# Patient Record
Sex: Female | Born: 2000 | Race: Black or African American | Hispanic: No | Marital: Single | Smoking: Former smoker
Health system: Southern US, Community
[De-identification: ages and names within clinical notes are randomized; demographics above are authoritative.]

## PROBLEM LIST (undated history)

## (undated) HISTORY — PX: DENTAL SURGERY: SHX609

---
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
## 2020-04-19 ENCOUNTER — Other Ambulatory Visit: Payer: Self-pay

## 2020-04-19 ENCOUNTER — Other Ambulatory Visit (HOSPITAL_COMMUNITY)
Admission: RE | Admit: 2020-04-19 | Discharge: 2020-04-19 | Disposition: A | Discharge status: Home / Self Care | Payer: Medicaid Other | Source: Ambulatory Visit | Attending: Family Medicine | Admitting: Family Medicine

## 2020-04-19 ENCOUNTER — Encounter: Payer: Self-pay | Admitting: Family Medicine

## 2020-04-19 ENCOUNTER — Ambulatory Visit (INDEPENDENT_AMBULATORY_CARE_PROVIDER_SITE_OTHER): Payer: Medicaid Other | Admitting: Family Medicine

## 2020-04-19 DIAGNOSIS — Z3481 Encounter for supervision of other normal pregnancy, first trimester: Secondary | ICD-10-CM

## 2020-04-19 DIAGNOSIS — Z3480 Encounter for supervision of other normal pregnancy, unspecified trimester: Secondary | ICD-10-CM | POA: Diagnosis not present

## 2020-04-19 DIAGNOSIS — Z3A10 10 weeks gestation of pregnancy: Secondary | ICD-10-CM | POA: Diagnosis not present

## 2020-04-19 NOTE — Progress Notes (Signed)
DATING AND VIABILITY SONOGRAM   Loretta Vazquez is a 19 y.o. year old G2P0010 with LMP Patient's last menstrual period was 02/08/2020 (exact date). which would correlate to  [redacted]w[redacted]d weeks gestation.  She has regular menstrual cycles.   She is here today for a confirmatory initial sonogram.    GESTATION: SINGLETON  FETAL ACTIVITY:          Heart rate         167 bpm          The fetus is active.     GESTATIONAL AGE AND  BIOMETRICS:  Gestational criteria: Estimated Date of Delivery: 11/14/20 by LMP now at [redacted]w[redacted]d  Previous Scans:0      CROWN RUMP LENGTH           3.65 cm         10-4 weeks                                                                               AVERAGE EGA(BY THIS SCAN): 10-4 weeks  WORKING EDD( LMP ):  11-14-2020   TECHNICIAN COMMENTS: Patient informed that the ultrasound is considered a limited obstetric ultrasound and is not intended to be a complete ultrasound exam. Patient also informed that the ultrasound is not being completed with the intent of assessing for fetal or placental anomalies or any pelvic abnormalities. Explained that the purpose of today's ultrasound is to assess for fetal heart rate. Patient acknowledges the purpose of the exam and the limitations of the study.     Armandina Stammer 04/19/2020 2:17 PM

## 2020-04-19 NOTE — Progress Notes (Signed)
  Subjective:  Loretta Vazquez is a G2P0010 [redacted]w[redacted]d being seen today for her first obstetrical visit.  Her obstetrical history is significant for first pregnancy. Unplanned, but happy about it. FOB is patient's boyfriend. Patient does intend to breast feed. Pregnancy history fully reviewed.  Patient reports no complaints.  BP (!) 108/57   Pulse 82   Wt 135 lb (61.2 kg)   LMP 02/08/2020 (Exact Date)   BMI 21.79 kg/m   HISTORY: OB History  Gravida Para Term Preterm AB Living  2       1    SAB TAB Ectopic Multiple Live Births  1            # Outcome Date GA Lbr Len/2nd Weight Sex Delivery Anes PTL Lv  2 Current           1 SAB 01/2020            No past medical history on file.  Past Surgical History:  Procedure Laterality Date  . DENTAL SURGERY      No family history on file.   Exam  BP (!) 108/57   Pulse 82   Wt 135 lb (61.2 kg)   LMP 02/08/2020 (Exact Date)   BMI 21.79 kg/m   Chaperone present during exam  CONSTITUTIONAL: Well-developed, well-nourished female in no acute distress.  HENT:  Normocephalic, atraumatic, External right and left ear normal. Oropharynx is clear and moist EYES: Conjunctivae and EOM are normal. Pupils are equal, round, and reactive to light. No scleral icterus.  NECK: Normal range of motion, supple, no masses.  Normal thyroid.  CARDIOVASCULAR: Normal heart rate noted, regular rhythm RESPIRATORY: Clear to auscultation bilaterally. Effort and breath sounds normal, no problems with respiration noted. BREASTS: Symmetric in size. No masses, skin changes, nipple drainage, or lymphadenopathy. ABDOMEN: Soft, normal bowel sounds, no distention noted.  No tenderness, rebound or guarding.  PELVIC: Normal appearing external genitalia; normal appearing vaginal mucosa and cervix. No abnormal discharge noted. Normal uterine size, no other palpable masses, no uterine or adnexal tenderness. MUSCULOSKELETAL: Normal range of motion. No tenderness.  No  cyanosis, clubbing, or edema.  2+ distal pulses. SKIN: Skin is warm and dry. No rash noted. Not diaphoretic. No erythema. No pallor. NEUROLOGIC: Alert and oriented to person, place, and time. Normal reflexes, muscle tone coordination. No cranial nerve deficit noted. PSYCHIATRIC: Normal mood and affect. Normal behavior. Normal judgment and thought content.    Assessment:    Pregnancy: G2P0010 Patient Active Problem List   Diagnosis Date Noted  . Supervision of other normal pregnancy, antepartum 04/19/2020      Plan:   1. Supervision of other normal pregnancy, antepartum FHT and Fh normal Desires Panorama Discussed practice, call schedule. New patient folder reviewed with patient  - [facility] AMB BABYSCRIPTS OPT IN - CBC/D/Plt+RPR+Rh+ABO+Rub Ab... - Hemoglobpathy+Fer w/A Thal Rfx - [place] MFM OB COMP + 14 WK; Future - Culture, OB Urine - GC/Chlamydia probe amp ()not at [facility]     Problem list reviewed and updated. 75% of 30 min visit spent on counseling and coordination of care.     Levie Heritage 04/19/2020

## 2020-04-21 LAB — CULTURE, OB URINE

## 2020-04-21 LAB — URINE CULTURE, OB REFLEX

## 2020-04-23 LAB — GC/CHLAMYDIA PROBE AMP (~~LOC~~) NOT AT ARMC
Chlamydia: NEGATIVE
Comment: NEGATIVE
Comment: NORMAL
Neisseria Gonorrhea: NEGATIVE

## 2020-04-24 LAB — CBC/D/PLT+RPR+RH+ABO+RUB AB...
Antibody Screen: NEGATIVE
Basophils Absolute: 0 10*3/uL (ref 0.0–0.2)
Basos: 0 %
EOS (ABSOLUTE): 0.1 10*3/uL (ref 0.0–0.4)
Eos: 1 %
HCV Ab: <0.1 s/co ratio (ref 0.0–0.9)
HIV Screen 4th Generation wRfx: NONREACTIVE
Hematocrit: 34.1 % (ref 34.0–46.6)
Hemoglobin: 11.6 g/dL (ref 11.1–15.9)
Hepatitis B Surface Ag: NEGATIVE
Immature Grans (Abs): 0 10*3/uL (ref 0.0–0.1)
Immature Granulocytes: 0 %
Lymphocytes Absolute: 1.3 10*3/uL (ref 0.7–3.1)
Lymphs: 14 %
MCH: 32 pg (ref 26.6–33.0)
MCHC: 34 g/dL (ref 31.5–35.7)
MCV: 94 fL (ref 79–97)
Monocytes Absolute: 0.7 10*3/uL (ref 0.1–0.9)
Monocytes: 8 %
Neutrophils Absolute: 7.6 10*3/uL — ABNORMAL HIGH (ref 1.4–7.0)
Neutrophils: 77 %
Platelets: 229 10*3/uL (ref 150–450)
RBC: 3.62 x10E6/uL — ABNORMAL LOW (ref 3.77–5.28)
RDW: 12.5 % (ref 11.7–15.4)
RPR Ser Ql: NONREACTIVE
Rh Factor: POSITIVE
Rubella Antibodies, IGG: 6.2 index (ref >0.99)
WBC: 9.8 10*3/uL (ref 3.4–10.8)

## 2020-04-24 LAB — HEMOGLOBPATHY+FER W/A THAL RFX
Ferritin: 65 ng/mL (ref 15–77)
Hgb A2: 2.6 % (ref 1.8–3.2)
Hgb A: 97.4 % (ref 96.4–98.8)
Hgb F: 0 % (ref 0.0–2.0)
Hgb S: 0 %

## 2020-04-24 LAB — HCV INTERPRETATION

## 2020-04-25 ENCOUNTER — Telehealth: Payer: Self-pay

## 2020-04-25 ENCOUNTER — Other Ambulatory Visit: Payer: Self-pay | Admitting: Advanced Practice Midwife

## 2020-04-25 MED ORDER — PRENATE MINI 18-0.6-0.4-350 MG PO CAPS: 1.0000 | ORAL_CAPSULE | Freq: Every day | ORAL | 10 refills | Status: DC

## 2020-04-25 MED FILL — PRENATE MINI SOFTGEL: 18-0.6-0.4- | 30 days supply | Qty: 30 | Fill #0

## 2020-04-25 NOTE — Telephone Encounter (Signed)
Patient called requesting prenatal vitamin. Armandina Stammer RN

## 2020-05-17 ENCOUNTER — Encounter: Payer: Medicaid Other | Admitting: Family Medicine

## 2020-05-17 ENCOUNTER — Ambulatory Visit (INDEPENDENT_AMBULATORY_CARE_PROVIDER_SITE_OTHER): Payer: Medicaid Other | Admitting: Family Medicine

## 2020-05-17 ENCOUNTER — Other Ambulatory Visit: Payer: Self-pay

## 2020-05-17 VITALS — BP 118/73 | HR 84

## 2020-05-17 DIAGNOSIS — Z3480 Encounter for supervision of other normal pregnancy, unspecified trimester: Secondary | ICD-10-CM

## 2020-05-17 DIAGNOSIS — Z3A14 14 weeks gestation of pregnancy: Secondary | ICD-10-CM

## 2020-05-17 DIAGNOSIS — Z3482 Encounter for supervision of other normal pregnancy, second trimester: Secondary | ICD-10-CM | POA: Diagnosis not present

## 2020-05-17 NOTE — Progress Notes (Signed)
   PRENATAL VISIT NOTE  Subjective:  Loretta Vazquez is a 19 y.o. G2P0010 at [redacted]w[redacted]d being seen today for ongoing prenatal care.  She is currently monitored for the following issues for this low-risk pregnancy and has Supervision of other normal pregnancy, antepartum on their problem list.  Patient reports no complaints.  Contractions: Not present. Vag. Bleeding: None.  Movement: Absent. Denies leaking of fluid.   The following portions of the patient's history were reviewed and updated as appropriate: allergies, current medications, past family history, past medical history, past social history, past surgical history and problem list.   Objective:   Vitals:   05/17/20 1307  BP: 118/73  Pulse: 84    Fetal Status: Fetal Heart Rate (bpm): 158   Movement: Absent     General:  Alert, oriented and cooperative. Patient is in no acute distress.  Skin: Skin is warm and dry. No rash noted.   Cardiovascular: Normal heart rate noted  Respiratory: Normal respiratory effort, no problems with respiration noted  Abdomen: Soft, gravid, appropriate for gestational age.  Pain/Pressure: Absent     Pelvic: Cervical exam deferred        Extremities: Normal range of motion.  Edema: None  Mental Status: Normal mood and affect. Normal behavior. Normal judgment and thought content.   Assessment and Plan:  Pregnancy: G2P0010 at [redacted]w[redacted]d 1. [redacted] weeks gestation of pregnancy FHT and FH normal. Interested in Systems developer. Handout given.  Preterm labor symptoms and general obstetric precautions including but not limited to vaginal bleeding, contractions, leaking of fluid and fetal movement were reviewed in detail with the patient. Please refer to After Visit Summary for other counseling recommendations.   No follow-ups on file.  Future Appointments  Date Time Provider Department Center  06/20/2020  7:45 AM [facility] US[facility]    Levie Heritage, DO

## 2020-05-24 ENCOUNTER — Telehealth: Payer: Self-pay

## 2020-05-24 NOTE — Telephone Encounter (Signed)
Called pt to discuss Panorama results. Pt made aware that her Panorama results were normal. Pt states she does not want to know the gender but she would like a copy of her results.  Loretta Vazquez, CMA

## 2020-05-28 MED FILL — PRENATE MINI SOFTGEL: 18-0.6-0.4- | 30 days supply | Qty: 30 | Fill #1

## 2020-06-14 ENCOUNTER — Other Ambulatory Visit: Payer: Self-pay

## 2020-06-14 ENCOUNTER — Ambulatory Visit (INDEPENDENT_AMBULATORY_CARE_PROVIDER_SITE_OTHER): Payer: Medicaid Other | Admitting: Family Medicine

## 2020-06-14 VITALS — BP 111/61 | HR 92 | Wt 150.0 lb

## 2020-06-14 DIAGNOSIS — Z3A18 18 weeks gestation of pregnancy: Secondary | ICD-10-CM | POA: Diagnosis not present

## 2020-06-14 DIAGNOSIS — Z3480 Encounter for supervision of other normal pregnancy, unspecified trimester: Secondary | ICD-10-CM

## 2020-06-14 NOTE — Progress Notes (Signed)
   PRENATAL VISIT NOTE  Subjective:  Loretta Vazquez is a 19 y.o. G2P0010 at [redacted]w[redacted]d being seen today for ongoing prenatal care.  She is currently monitored for the following issues for this low-risk pregnancy and has Supervision of other normal pregnancy, antepartum on their problem list.  Patient reports no complaints.  Contractions: Not present. Vag. Bleeding: None.  Movement: Present. Denies leaking of fluid.   The following portions of the patient's history were reviewed and updated as appropriate: allergies, current medications, past family history, past medical history, past social history, past surgical history and problem list.   Objective:   Vitals:   06/14/20 1415  BP: 111/61  Pulse: 92  Weight: 150 lb (68 kg)    Fetal Status: Fetal Heart Rate (bpm): 154 Fundal Height: 19 cm Movement: Present     General:  Alert, oriented and cooperative. Patient is in no acute distress.  Skin: Skin is warm and dry. No rash noted.   Cardiovascular: Normal heart rate noted  Respiratory: Normal respiratory effort, no problems with respiration noted  Abdomen: Soft, gravid, appropriate for gestational age.  Pain/Pressure: Absent     Pelvic: Cervical exam deferred        Extremities: Normal range of motion.  Edema: None  Mental Status: Normal mood and affect. Normal behavior. Normal judgment and thought content.   Assessment and Plan:  Pregnancy: G2P0010 at [redacted]w[redacted]d 1. Supervision of other normal pregnancy, antepartum FHT and FH normal  2. [redacted] weeks gestation of pregnancy - AFP, Serum, Open Spina Bifida  Preterm labor symptoms and general obstetric precautions including but not limited to vaginal bleeding, contractions, leaking of fluid and fetal movement were reviewed in detail with the patient. Please refer to After Visit Summary for other counseling recommendations.   No follow-ups on file.  Future Appointments  Date Time Provider Department Center  06/20/2020  7:45 AM [facility] US[facility]  [facility]    Levie Heritage, DO

## 2020-06-16 LAB — AFP, SERUM, OPEN SPINA BIFIDA
AFP MoM: 2.12
AFP Value: 105.9 ng/mL
Gest. Age on Collection Date: 18.1 weeks
Maternal Age At EDD: 20 yr
OSBR Risk 1 IN: 1240
Test Results:: NEGATIVE
Weight: 150 [lb_av]

## 2020-06-20 ENCOUNTER — Other Ambulatory Visit: Payer: Self-pay

## 2020-06-20 ENCOUNTER — Other Ambulatory Visit: Payer: Self-pay | Admitting: *Deleted

## 2020-06-20 ENCOUNTER — Ambulatory Visit: Payer: Medicaid Other | Attending: Obstetrics and Gynecology

## 2020-06-20 DIAGNOSIS — Z3480 Encounter for supervision of other normal pregnancy, unspecified trimester: Secondary | ICD-10-CM | POA: Insufficient documentation

## 2020-06-20 DIAGNOSIS — Z362 Encounter for other antenatal screening follow-up: Secondary | ICD-10-CM

## 2020-06-23 ENCOUNTER — Encounter (HOSPITAL_BASED_OUTPATIENT_CLINIC_OR_DEPARTMENT_OTHER): Payer: Self-pay

## 2020-06-23 ENCOUNTER — Other Ambulatory Visit: Payer: Self-pay

## 2020-06-23 ENCOUNTER — Emergency Department (HOSPITAL_BASED_OUTPATIENT_CLINIC_OR_DEPARTMENT_OTHER)
Admission: EM | Admit: 2020-06-23 | Discharge: 2020-06-23 | Disposition: A | Discharge status: Home / Self Care | Payer: Medicaid Other | Attending: Emergency Medicine | Admitting: Emergency Medicine

## 2020-06-23 DIAGNOSIS — R3915 Urgency of urination: Secondary | ICD-10-CM | POA: Diagnosis not present

## 2020-06-23 DIAGNOSIS — Z3A19 19 weeks gestation of pregnancy: Secondary | ICD-10-CM | POA: Insufficient documentation

## 2020-06-23 DIAGNOSIS — O99891 Other specified diseases and conditions complicating pregnancy: Secondary | ICD-10-CM | POA: Diagnosis not present

## 2020-06-23 DIAGNOSIS — R1032 Left lower quadrant pain: Secondary | ICD-10-CM | POA: Diagnosis not present

## 2020-06-23 DIAGNOSIS — Z87891 Personal history of nicotine dependence: Secondary | ICD-10-CM | POA: Diagnosis not present

## 2020-06-23 DIAGNOSIS — M545 Low back pain: Secondary | ICD-10-CM | POA: Insufficient documentation

## 2020-06-23 DIAGNOSIS — O26892 Other specified pregnancy related conditions, second trimester: Secondary | ICD-10-CM | POA: Diagnosis not present

## 2020-06-23 LAB — URINALYSIS, ROUTINE W REFLEX MICROSCOPIC
Bilirubin Urine: NEGATIVE
Glucose, UA: NEGATIVE mg/dL
Ketones, ur: NEGATIVE mg/dL
Nitrite: NEGATIVE
Protein, ur: NEGATIVE mg/dL
Specific Gravity, Urine: 1.02 (ref 1.005–1.030)
pH: 7 (ref 5.0–8.0)

## 2020-06-23 LAB — URINALYSIS, MICROSCOPIC (REFLEX)

## 2020-06-23 LAB — WET PREP, GENITAL
Sperm: NONE SEEN
Trich, Wet Prep: NONE SEEN
Yeast Wet Prep HPF POC: NONE SEEN

## 2020-06-23 MED ORDER — ACETAMINOPHEN 500 MG PO TABS: 1000.0000 mg | ORAL_TABLET | Freq: Four times a day (QID) | ORAL | 0 refills | Status: DC | PRN

## 2020-06-23 MED ORDER — ACETAMINOPHEN 500 MG PO TABS
1000.0000 mg | ORAL_TABLET | Freq: Once | ORAL | Status: AC
  Administered 2020-06-23: 1000 mg via ORAL
  Filled 2020-06-23: qty 2

## 2020-06-23 NOTE — ED Provider Notes (Signed)
[facility] EMERGENCY DEPARTMENT Provider Note   CSN: [id number] Arrival date & time: 06/23/20  0809     History Chief Complaint  Patient presents with  . Vaginal Bleeding    Loretta Vazquez is a 19 y.o. female.  HPI Patient is 19 year old G2P0010 [redacted] weeks gestation.  Patient reports she is having some left lower back pain starting 2 days ago.  All these aching and in the low back.  Yesterday she started to note some discomfort also in the left lower abdomen.  Aching quality.  No radiation to the legs.  No fevers, no chills, no pain burning urgency with urination.  No blood noted in the urine.  Patient did note that there has been some light pink-tinged drainage when she wipes after urinating. No spotting in underwear, active bleeding or clots.  Patient has not tried anything for pain.  She does drive doing food delivery service.  So she spends a fair amount of time in a seated position in her car.    History reviewed. No pertinent past medical history.  Patient Active Problem List   Diagnosis Date Noted  . Supervision of other normal pregnancy, antepartum 04/19/2020    Past Surgical History:  Procedure Laterality Date  . DENTAL SURGERY       OB History    Gravida  2   Para      Term      Preterm      AB  1   Living        SAB  1   TAB      Ectopic      Multiple      Live Births              No family history on file.  Social History   Tobacco Use  . Smoking status: Former Smoker    Packs/day: 0.00    Types: Cigars  . Smokeless tobacco: Never Used  Vaping Use  . Vaping Use: Never used  Substance Use Topics  . Alcohol use: No  . Drug use: Not Currently    Types: Marijuana    Home Medications Prior to Admission medications   Medication Sig Start Date End Date Taking? Authorizing Provider  acetaminophen (TYLENOL) 500 MG tablet Take 2 tablets (1,000 mg total) by mouth every 6 (six) hours as needed. 06/23/20   Arby Barrette, MD    predniSONE (DELTASONE) 20 MG tablet 3 Tabs PO Days 1-3, then 2 tabs PO Days 4-6, then 1 tab PO Day 7-9, then Half Tab PO Day 10-12 Patient not taking: Reported on 04/19/2020 09/09/19   Renne Crigler, PA-C  Prenat-FeCbn-FeAsp-Meth-FA-DHA (PRENATE MINI) 18-0.6-0.4-350 MG CAPS Take 1 capsule by mouth daily. 04/25/20   Aviva Signs, CNM  Prenatal Vit-Fe Fumarate-FA (PRENATAL VITAMINS PO) Take by mouth. Patient not taking: Reported on 06/14/2020    [provider]    Allergies    Patient has no known allergies.  Review of Systems   Review of Systems 10 systems reviewed and negative except as per HPI Physical Exam Updated Vital Signs BP 106/62 (BP Location: Right Arm)   Pulse 74   Temp 98.6 F (37 C) (Oral)   Resp 14   Ht 5\' 6"  (1.676 m)   Wt 71.5 kg   LMP 02/08/2020 (Exact Date)   SpO2 100%   BMI 25.44 kg/m   Physical Exam Constitutional:      Appearance: She is well-developed.     Comments: Patient  is alert and well in appearance.  No distress.  HENT:     Head: Normocephalic and atraumatic.     Mouth/Throat:     Pharynx: Oropharynx is clear.  Eyes:     Conjunctiva/sclera: Conjunctivae normal.  Cardiovascular:     Rate and Rhythm: Normal rate and regular rhythm.     Heart sounds: Normal heart sounds.  Pulmonary:     Effort: Pulmonary effort is normal.     Breath sounds: Normal breath sounds.  Abdominal:     General: Bowel sounds are normal. There is no distension.     Palpations: Abdomen is soft.     Tenderness: There is no abdominal tenderness.     Comments: Patient has gravid uterus to the umbilicus.  Abdomen is nontender.  No left upper quadrant tenderness to palpation, no tenderness of the left uterine margins.  No CVA tenderness.  Genitourinary:    Comments: Cervix moderate whitish-yellow discharge.  No blood or clot.  Slightly friable.  Some red bleeding with specimen collection.  Cervix is soft and closed.  Cervix and uterus are nontender to palpation.   No focal tenderness along left margins of uterus. Musculoskeletal:        General: Normal range of motion.     Cervical back: Neck supple.  Skin:    General: Skin is warm and dry.  Neurological:     Mental Status: She is alert and oriented to person, place, and time.     GCS: GCS eye subscore is 4. GCS verbal subscore is 5. GCS motor subscore is 6.     Coordination: Coordination normal.     ED Results / Procedures / Treatments   Labs (all labs ordered are listed, but only abnormal results are displayed) Labs Reviewed  WET PREP, GENITAL - Abnormal; Notable for the following components:      Result Value   Clue Cells Wet Prep HPF POC PRESENT (*)    WBC, Wet Prep HPF POC MANY (*)    All other components within normal limits  URINALYSIS, ROUTINE W REFLEX MICROSCOPIC - Abnormal; Notable for the following components:   APPearance CLOUDY (*)    Hgb urine dipstick MODERATE (*)    Leukocytes,Ua SMALL (*)    All other components within normal limits  URINALYSIS, MICROSCOPIC (REFLEX) - Abnormal; Notable for the following components:   Bacteria, UA FEW (*)    All other components within normal limits  URINE CULTURE  GC/CHLAMYDIA PROBE AMP ([place]) NOT AT [facility]    EKG None  Radiology No results found.  Procedures Procedures (including critical care time)  Medications Ordered in ED Medications  acetaminophen (TYLENOL) tablet 1,000 mg (1,000 mg Oral Given 06/23/20 0951)    ED Course  I have reviewed the triage vital signs and the nursing notes.  Pertinent labs & imaging results that were available during my care of the patient were reviewed by me and considered in my medical decision making (see chart for details).    MDM Rules/Calculators/A&P                         Patient presents with lower back pain in pregnancy.  This is been predominantly left-sided.  One point she did perceive some pain in the left lower abdomen as well.  No radicular symptoms.  No pain burning  urgency with urination.  Appears lower probability to be kidney stone.  Pain is not severe and she feels pain very  low in the back, no pain in the CVA area to percussion.  Patient currently has no associated lower abdominal pain.  Abdominal exam is soft and nontender.  Uterus is not tender to palpation.  Patient had ultrasound within the past 4 days without any abnormal findings.  At this time do not think repeat ultrasound is indicated.  Urinalysis not suggestive of UTI.  0-5 white cells.  Trace leuk esterase.  Will obtain urine culture.  At this time, plan will be for symptomatic treatment with warm moist heat and Tylenol as needed.  Close follow-up with OB/GYN.  Return precautions reviewed.  Final Clinical Impression(s) / ED Diagnoses Final diagnoses:  Low back pain during pregnancy in second trimester    Rx / DC Orders ED Discharge Orders         Ordered    acetaminophen (TYLENOL) 500 MG tablet  Every 6 hours PRN        06/23/20 1125           Arby Barrette, MD 06/23/20 1131

## 2020-06-23 NOTE — ED Notes (Signed)
ED Provider at bedside. 

## 2020-06-23 NOTE — ED Triage Notes (Signed)
Pt arrives with c/o left lower back pain starting yesterday and LLQ abdominal pain this morning. Pt is 19 weeks with EDD of 11-14-20, c/o vaginal bleeding starting this morning states there was a light amount on toilet paper.

## 2020-06-23 NOTE — Discharge Instructions (Signed)
1.  At this time your back pain appears to be due to muscular strain in the lower back.  You may take Tylenol for pain.  You may apply moist warm heat. 2.  Return to the emergency department if you develop any pain with urination, worsening or suddenly worsening pain, fever vomiting, abdominal pain or other concerning symptoms. 3.  Schedule a recheck with your obstetrician within the next 2 to 4 days.

## 2020-06-24 LAB — URINE CULTURE: Culture: <10000 — AB

## 2020-06-25 LAB — GC/CHLAMYDIA PROBE AMP (~~LOC~~) NOT AT ARMC
Chlamydia: POSITIVE — AB
Comment: NEGATIVE
Comment: NORMAL
Neisseria Gonorrhea: NEGATIVE

## 2020-06-26 ENCOUNTER — Other Ambulatory Visit: Payer: Self-pay

## 2020-06-26 ENCOUNTER — Telehealth: Payer: Self-pay

## 2020-06-26 MED ORDER — AZITHROMYCIN 250 MG PO TABS
1000.0000 mg | ORAL_TABLET | Freq: Once | ORAL | 1 refills | Status: AC
Start: 2020-06-26 — End: 2020-06-26

## 2020-06-26 MED ORDER — AZITHROMYCIN 250 MG PO TABS: 1000.0000 mg | ORAL_TABLET | Freq: Once | ORAL | 1 refills | Status: DC

## 2020-06-26 MED FILL — AZITHROMYCIN 250 MG TABS: 250 | 1 days supply | Qty: 4 | Fill #0

## 2020-06-26 NOTE — Telephone Encounter (Signed)
Patient states she was seen in ED recently and got a positive chlamydia result. Patient made aware medication will be sent to pharmacy and she needs to refrain from intercourse for two weeks and her partner needs to be tested and/or treated. Patient states understanding.

## 2020-06-27 MED FILL — AZITHROMYCIN 250 MG TABS: 250 | 1 days supply | Qty: 4 | Fill #0

## 2020-06-28 ENCOUNTER — Encounter: Payer: Medicaid Other | Admitting: Family Medicine

## 2020-06-28 ENCOUNTER — Inpatient Hospital Stay (HOSPITAL_COMMUNITY)
Admission: AD | Admit: 2020-06-28 | Discharge: 2020-06-28 | Disposition: A | Discharge status: Home / Self Care | Payer: Medicaid Other | Attending: Obstetrics and Gynecology | Admitting: Obstetrics and Gynecology

## 2020-06-28 ENCOUNTER — Encounter (HOSPITAL_COMMUNITY): Payer: Self-pay | Admitting: Obstetrics and Gynecology

## 2020-06-28 ENCOUNTER — Telehealth: Payer: Self-pay

## 2020-06-28 ENCOUNTER — Other Ambulatory Visit: Payer: Self-pay

## 2020-06-28 DIAGNOSIS — M545 Low back pain: Secondary | ICD-10-CM | POA: Insufficient documentation

## 2020-06-28 DIAGNOSIS — M549 Dorsalgia, unspecified: Secondary | ICD-10-CM | POA: Diagnosis not present

## 2020-06-28 DIAGNOSIS — O99322 Drug use complicating pregnancy, second trimester: Secondary | ICD-10-CM | POA: Insufficient documentation

## 2020-06-28 DIAGNOSIS — Z87891 Personal history of nicotine dependence: Secondary | ICD-10-CM | POA: Insufficient documentation

## 2020-06-28 DIAGNOSIS — F1290 Cannabis use, unspecified, uncomplicated: Secondary | ICD-10-CM | POA: Diagnosis not present

## 2020-06-28 DIAGNOSIS — Z3A20 20 weeks gestation of pregnancy: Secondary | ICD-10-CM

## 2020-06-28 DIAGNOSIS — O26892 Other specified pregnancy related conditions, second trimester: Secondary | ICD-10-CM | POA: Diagnosis not present

## 2020-06-28 DIAGNOSIS — O99891 Other specified diseases and conditions complicating pregnancy: Secondary | ICD-10-CM | POA: Diagnosis not present

## 2020-06-28 LAB — URINALYSIS, ROUTINE W REFLEX MICROSCOPIC
Bilirubin Urine: NEGATIVE
Glucose, UA: NEGATIVE mg/dL
Hgb urine dipstick: NEGATIVE
Ketones, ur: NEGATIVE mg/dL
Leukocytes,Ua: NEGATIVE
Nitrite: NEGATIVE
Protein, ur: NEGATIVE mg/dL
Specific Gravity, Urine: 1.015 (ref 1.005–1.030)
pH: 7.5 (ref 5.0–8.0)

## 2020-06-28 MED ORDER — CYCLOBENZAPRINE HCL 5 MG PO TABS
10.0000 mg | ORAL_TABLET | Freq: Once | ORAL | Status: AC
  Administered 2020-06-28: 10 mg via ORAL
  Filled 2020-06-28: qty 2

## 2020-06-28 MED ORDER — KETOROLAC TROMETHAMINE 60 MG/2ML IM SOLN
60.0000 mg | Freq: Once | INTRAMUSCULAR | Status: AC
  Administered 2020-06-28: 60 mg via INTRAMUSCULAR
  Filled 2020-06-28: qty 2

## 2020-06-28 MED ORDER — IBUPROFEN 600 MG PO TABS: 600.0000 mg | ORAL_TABLET | Freq: Four times a day (QID) | ORAL | 0 refills | Status: DC | PRN

## 2020-06-28 MED ORDER — CYCLOBENZAPRINE HCL 10 MG PO TABS: 10.0000 mg | ORAL_TABLET | Freq: Every day | ORAL | 0 refills | Status: DC

## 2020-06-28 MED FILL — IBUPROFEN 600 MG TABLET: 600 | 3 days supply | Qty: 10 | Fill #0

## 2020-06-28 MED FILL — PRENATE MINI SOFTGEL: 18-0.6-0.4- | 30 days supply | Qty: 30 | Fill #2

## 2020-06-28 MED FILL — CYCLOBENZAPRINE HCL 10 MG T: 10 | 20 days supply | Qty: 20 | Fill #0

## 2020-06-28 NOTE — MAU Provider Note (Addendum)
History     CSN: [id number]  Arrival date and time: 06/28/20 1114   First Provider Initiated Contact with Patient 06/28/20 1139      Chief Complaint  Patient presents with   Back Pain   HPI  Ms. Loretta Vazquez is a 19 y.o. G2P0010 at [redacted]w[redacted]d who presents to MAU today with complaint of right lower back pain. She was seen in the ED within the last week with left sided pain. She was also diagnosed with Chlamydia at that time which was treated. She denies pain now and states pain is 10/10 at the worst. It was worse early this morning. The pain comes and goes and isn't associated with any particular movement or actions. She denies bleeding, UTI symptoms, recent injury. She last took Tylenol at 1000 today.   OB History    Gravida  2   Para      Term      Preterm      AB  1   Living        SAB  1   TAB      Ectopic      Multiple      Live Births              History reviewed. No pertinent past medical history.  Past Surgical History:  Procedure Laterality Date   DENTAL SURGERY      History reviewed. No pertinent family history.  Social History   Tobacco Use   Smoking status: Former Smoker    Packs/day: 0.00    Types: Cigars   Smokeless tobacco: Never Used  Vaping Use   Vaping Use: Never used  Substance Use Topics   Alcohol use: No   Drug use: Not Currently    Types: Marijuana    Comment: Last smoked June 2021    Allergies: No Known Allergies  Medications Prior to Admission  Medication Sig Dispense Refill Last Dose   acetaminophen (TYLENOL) 500 MG tablet Take 2 tablets (1,000 mg total) by mouth every 6 (six) hours as needed. 30 tablet 0    predniSONE (DELTASONE) 20 MG tablet 3 Tabs PO Days 1-3, then 2 tabs PO Days 4-6, then 1 tab PO Day 7-9, then Half Tab PO Day 10-12 (Patient not taking: Reported on 04/19/2020) 20 tablet 0    Prenat-FeCbn-FeAsp-Meth-FA-DHA (PRENATE MINI) 18-0.6-0.4-350 MG CAPS Take 1 capsule by mouth daily. 30 capsule 10  06/28/2020 at 0940   Prenatal Vit-Fe Fumarate-FA (PRENATAL VITAMINS PO) Take by mouth. (Patient not taking: Reported on 06/14/2020)       Review of Systems  Constitutional: Negative for fever.  Gastrointestinal: Negative for abdominal pain, constipation, diarrhea, nausea and vomiting.  Genitourinary: Negative for vaginal bleeding and vaginal discharge.  Musculoskeletal: Positive for back pain.   Physical Exam   Blood pressure 111/65, pulse 86, temperature 98.4 F (36.9 C), temperature source Oral, resp. rate 20, height 5\' 6"  (1.676 m), weight 71.6 kg, last menstrual period 02/08/2020, SpO2 100 %.  Physical Exam Vitals and nursing note reviewed.  Constitutional:      General: She is not in acute distress.    Appearance: She is well-developed.  HENT:     Head: Normocephalic and atraumatic.  Cardiovascular:     Rate and Rhythm: Normal rate.  Pulmonary:     Effort: Pulmonary effort is normal.  Abdominal:     General: There is no distension.     Palpations: Abdomen is soft. There is no mass.  Tenderness: There is no abdominal tenderness. There is no right CVA tenderness, left CVA tenderness, guarding or rebound.  Musculoskeletal:     Thoracic back: Normal. No spasms or tenderness.     Lumbar back: Normal. No spasms or tenderness.  Skin:    General: Skin is warm and dry.     Findings: No erythema.  Neurological:     Mental Status: She is alert and oriented to person, place, and time.     Results for orders placed or performed during the hospital encounter of 06/28/20 (from the past 24 hour(s))  Urinalysis, Routine w reflex microscopic Urine, Clean Catch     Status: Abnormal   Collection Time: 06/28/20 11:37 AM  Result Value Ref Range   Color, Urine YELLOW YELLOW   APPearance CLOUDY (A) CLEAR   Specific Gravity, Urine 1.015 1.005 - 1.030   pH 7.5 5.0 - 8.0   Glucose, UA NEGATIVE NEGATIVE mg/dL   Hgb urine dipstick NEGATIVE NEGATIVE   Bilirubin Urine NEGATIVE NEGATIVE    Ketones, ur NEGATIVE NEGATIVE mg/dL   Protein, ur NEGATIVE NEGATIVE mg/dL   Nitrite NEGATIVE NEGATIVE   Leukocytes,Ua NEGATIVE NEGATIVE    MAU Course  Procedures None  MDM Reviewed last ED visit. + Chlamydia, normal urine culture.  UA today  FHR - 152 with doppler performed by RN Patient currently having no pain. Rx for Flexeril and abdominal binder discussed.  Prior to discharge, patient called out that she was now having 10/10 right sided mid-back pain. Exam unchanged on re-assessment although patient is tearful, she is still non-tender to palpation and without CVA tenderness 10 mg Flexeril ordered in MAU Care turned over to Loretta Carbon, NP Loretta Nipple, PA-C 06/28/2020, 1:44 PM   Toradol 60 mg IV given. Patient reports pain 0/10 Cervix is long thick and closed.  Assessment and Plan    A:  1. Back pain affecting pregnancy in second trimester   2. [redacted] weeks gestation of pregnancy     P:  Likely musculoskeletal Rx: short course of ibuprofen in 2nd trimester only Return to MAU if symptoms worsen Alternate heat/cold  Loretta Vazquez, Loretta Rutherford, NP 06/29/2020 9:57 AM

## 2020-06-28 NOTE — Telephone Encounter (Signed)
Pt's boyfriend called stating pt was seen in the ED for back pain. Pt's boyfriend states tylenol is not providing any relief.  Advised pt to come in to be seen. Understanding was voiced.  Bode Pieper l Daisi Kentner, CMA

## 2020-06-28 NOTE — Discharge Instructions (Signed)

## 2020-06-28 NOTE — MAU Note (Signed)
Presents with c/o right flank pain that began early this morning.  Reports pain unrelived with Tylenol, last taken @ 1000.  Denies VB or LOF.

## 2020-07-12 ENCOUNTER — Other Ambulatory Visit: Payer: Self-pay

## 2020-07-12 ENCOUNTER — Ambulatory Visit (INDEPENDENT_AMBULATORY_CARE_PROVIDER_SITE_OTHER): Payer: Medicaid Other | Admitting: Family Medicine

## 2020-07-12 VITALS — BP 122/66 | HR 83 | Wt 155.0 lb

## 2020-07-12 DIAGNOSIS — A749 Chlamydial infection, unspecified: Secondary | ICD-10-CM

## 2020-07-12 DIAGNOSIS — O98812 Other maternal infectious and parasitic diseases complicating pregnancy, second trimester: Secondary | ICD-10-CM

## 2020-07-12 DIAGNOSIS — Z3A22 22 weeks gestation of pregnancy: Secondary | ICD-10-CM

## 2020-07-12 DIAGNOSIS — Z3480 Encounter for supervision of other normal pregnancy, unspecified trimester: Secondary | ICD-10-CM

## 2020-07-12 NOTE — Progress Notes (Signed)
   PRENATAL VISIT NOTE  Subjective:  Loretta Vazquez is a 19 y.o. G2P0010 at [redacted]w[redacted]d being seen today for ongoing prenatal care.  She is currently monitored for the following issues for this low-risk pregnancy and has Supervision of other normal pregnancy, antepartum on their problem list.  Patient reports no complaints.  Contractions: Not present.  .  Movement: Present. Denies leaking of fluid.   The following portions of the patient's history were reviewed and updated as appropriate: allergies, current medications, past family history, past medical history, past social history, past surgical history and problem list.   Objective:   Vitals:   07/12/20 0840  BP: 122/66  Pulse: 83  Weight: 155 lb (70.3 kg)    Fetal Status: Fetal Heart Rate (bpm): 140   Movement: Present     General:  Alert, oriented and cooperative. Patient is in no acute distress.  Skin: Skin is warm and dry. No rash noted.   Cardiovascular: Normal heart rate noted  Respiratory: Normal respiratory effort, no problems with respiration noted  Abdomen: Soft, gravid, appropriate for gestational age.  Pain/Pressure: Absent     Pelvic: Cervical exam deferred        Extremities: Normal range of motion.  Edema: None  Mental Status: Normal mood and affect. Normal behavior. Normal judgment and thought content.   Assessment and Plan:  Pregnancy: G2P0010 at [redacted]w[redacted]d 1. Supervision of other normal pregnancy, antepartum 2. [redacted] weeks gestation of pregnancy FHT and FH normal 2hr GTT next visit  3. Chlamydia infection affecting pregnancy in second trimester Retest in 4 weeks  Preterm labor symptoms and general obstetric precautions including but not limited to vaginal bleeding, contractions, leaking of fluid and fetal movement were reviewed in detail with the patient. Please refer to After Visit Summary for other counseling recommendations.   Return in about 4 weeks (around 08/09/2020) for OB f/u, 2 hr GTT, In Office.  Future  Appointments  Date Time Provider Department Center  07/18/2020  9:30 AM [facility] NURSE [facility]  07/18/2020  9:45 AM [facility]    Levie Heritage, DO

## 2020-07-18 ENCOUNTER — Other Ambulatory Visit: Payer: Self-pay

## 2020-07-18 ENCOUNTER — Ambulatory Visit: Payer: Medicaid Other

## 2020-07-18 ENCOUNTER — Ambulatory Visit: Payer: Medicaid Other | Attending: Obstetrics

## 2020-07-18 DIAGNOSIS — Z362 Encounter for other antenatal screening follow-up: Secondary | ICD-10-CM | POA: Diagnosis not present

## 2020-07-18 DIAGNOSIS — Z3A23 23 weeks gestation of pregnancy: Secondary | ICD-10-CM

## 2020-07-20 DIAGNOSIS — Z20822 Contact with and (suspected) exposure to covid-19: Secondary | ICD-10-CM | POA: Diagnosis not present

## 2020-07-30 MED FILL — PRENATE MINI SOFTGEL: 18-0.6-0.4- | 30 days supply | Qty: 30 | Fill #3

## 2020-08-10 ENCOUNTER — Other Ambulatory Visit: Payer: Self-pay

## 2020-08-10 ENCOUNTER — Other Ambulatory Visit (HOSPITAL_COMMUNITY)
Admission: RE | Admit: 2020-08-10 | Discharge: 2020-08-10 | Disposition: A | Discharge status: Home / Self Care | Payer: Medicaid Other | Source: Ambulatory Visit | Attending: Family Medicine | Admitting: Family Medicine

## 2020-08-10 ENCOUNTER — Ambulatory Visit (INDEPENDENT_AMBULATORY_CARE_PROVIDER_SITE_OTHER): Payer: Medicaid Other | Admitting: Family Medicine

## 2020-08-10 ENCOUNTER — Other Ambulatory Visit: Payer: Self-pay | Admitting: Family Medicine

## 2020-08-10 VITALS — BP 103/52 | HR 70 | Wt 163.0 lb

## 2020-08-10 DIAGNOSIS — A749 Chlamydial infection, unspecified: Secondary | ICD-10-CM

## 2020-08-10 DIAGNOSIS — Z3A26 26 weeks gestation of pregnancy: Secondary | ICD-10-CM

## 2020-08-10 DIAGNOSIS — Z3480 Encounter for supervision of other normal pregnancy, unspecified trimester: Secondary | ICD-10-CM

## 2020-08-10 DIAGNOSIS — O98812 Other maternal infectious and parasitic diseases complicating pregnancy, second trimester: Secondary | ICD-10-CM

## 2020-08-10 DIAGNOSIS — D508 Other iron deficiency anemias: Secondary | ICD-10-CM

## 2020-08-10 MED ORDER — AZITHROMYCIN 500 MG PO TABS: 1000.0000 mg | ORAL_TABLET | Freq: Once | ORAL | 1 refills | Status: DC

## 2020-08-10 MED ORDER — KOSHER PRENATAL PLUS IRON 30-1 MG PO TABS: 1.0000 | ORAL_TABLET | Freq: Every day | ORAL | 3 refills | Status: DC

## 2020-08-10 MED FILL — AZITHROMYCIN 500 MG TABS: 500 | 1 days supply | Qty: 2 | Fill #0

## 2020-08-10 NOTE — Progress Notes (Signed)
   PRENATAL VISIT NOTE  Subjective:  Loretta Vazquez is a 19 y.o. G2P0010 at [redacted]w[redacted]d being seen today for ongoing prenatal care.  She is currently monitored for the following issues for this low-risk pregnancy and has Supervision of other normal pregnancy, antepartum on their problem list.  Patient reports no complaints.  Contractions: Not present. Vag. Bleeding: None.  Movement: Present. Denies leaking of fluid.   The following portions of the patient's history were reviewed and updated as appropriate: allergies, current medications, past family history, past medical history, past social history, past surgical history and problem list.   Objective:   Vitals:   08/10/20 0831  BP: (!) 103/52  Pulse: 70  Weight: 163 lb (73.9 kg)    Fetal Status: Fetal Heart Rate (bpm): 145   Movement: Present     General:  Alert, oriented and cooperative. Patient is in no acute distress.  Skin: Skin is warm and dry. No rash noted.   Cardiovascular: Normal heart rate noted  Respiratory: Normal respiratory effort, no problems with respiration noted  Abdomen: Soft, gravid, appropriate for gestational age.  Pain/Pressure: Absent     Pelvic: Cervical exam deferred        Extremities: Normal range of motion.  Edema: None  Mental Status: Normal mood and affect. Normal behavior. Normal judgment and thought content.   Assessment and Plan:  Pregnancy: G2P0010 at [redacted]w[redacted]d 1. Supervision of other normal pregnancy, antepartum FHT and FH normal [place] reviewed - normal anatomy. - Glucose Tolerance, 2 Hours w/1 Hour - RPR - HIV antibody (with reflex) - CBC - GC/Chlamydia probe amp ([place])not at [facility]  2. [redacted] weeks gestation of pregnancy - Glucose Tolerance, 2 Hours w/1 Hour - RPR - HIV antibody (with reflex) - CBC - GC/Chlamydia probe amp ([place])not at [facility]  3. Chlamydia infection affecting pregnancy in second trimester Boyfriend got retested - still had chlamydia. Will retreat. Was having sex  before 2 weeks were up.  Preterm labor symptoms and general obstetric precautions including but not limited to vaginal bleeding, contractions, leaking of fluid and fetal movement were reviewed in detail with the patient. Please refer to After Visit Summary for other counseling recommendations.   Return in about 4 weeks (around 09/07/2020) for OB f/u, In Office.  Future Appointments  Date Time Provider Department Center  09/06/2020  8:45 AM Levie Heritage, DO [facility] None    Levie Heritage, DO

## 2020-08-11 LAB — GLUCOSE TOLERANCE, 2 HOURS W/ 1HR
Glucose, 1 hour: 111 mg/dL (ref 65–179)
Glucose, 2 hour: 75 mg/dL (ref 65–152)
Glucose, Fasting: 78 mg/dL (ref 65–91)

## 2020-08-11 LAB — CBC
Hematocrit: 29.6 % — ABNORMAL LOW (ref 34.0–46.6)
Hemoglobin: 10.3 g/dL — ABNORMAL LOW (ref 11.1–15.9)
MCH: 33 pg (ref 26.6–33.0)
MCHC: 34.8 g/dL (ref 31.5–35.7)
MCV: 95 fL (ref 79–97)
Platelets: 216 10*3/uL (ref 150–450)
RBC: 3.12 x10E6/uL — ABNORMAL LOW (ref 3.77–5.28)
RDW: 12.3 % (ref 11.7–15.4)
WBC: 9.6 10*3/uL (ref 3.4–10.8)

## 2020-08-11 LAB — HIV ANTIBODY (ROUTINE TESTING W REFLEX): HIV Screen 4th Generation wRfx: NONREACTIVE

## 2020-08-11 LAB — RPR: RPR Ser Ql: NONREACTIVE

## 2020-08-13 DIAGNOSIS — D508 Other iron deficiency anemias: Secondary | ICD-10-CM | POA: Insufficient documentation

## 2020-08-13 LAB — GC/CHLAMYDIA PROBE AMP (~~LOC~~) NOT AT ARMC
Chlamydia: NEGATIVE
Comment: NEGATIVE
Comment: NORMAL
Neisseria Gonorrhea: NEGATIVE

## 2020-08-13 MED ORDER — FERROUS GLUCONATE 324 (38 FE) MG PO TABS: 324.0000 mg | ORAL_TABLET | Freq: Every day | ORAL | 3 refills | Status: DC

## 2020-08-13 NOTE — Addendum Note (Signed)
Addended by: Levie Heritage on: 08/13/2020 08:29 AM   Modules accepted: Orders

## 2020-09-04 MED FILL — PRENATE MINI SOFTGEL: 18-0.6-0.4- | 30 days supply | Qty: 30 | Fill #4

## 2020-09-06 ENCOUNTER — Other Ambulatory Visit: Payer: Self-pay

## 2020-09-06 ENCOUNTER — Ambulatory Visit (INDEPENDENT_AMBULATORY_CARE_PROVIDER_SITE_OTHER): Payer: Medicaid Other | Admitting: Family Medicine

## 2020-09-06 ENCOUNTER — Encounter (HOSPITAL_COMMUNITY): Payer: Self-pay | Admitting: Obstetrics and Gynecology

## 2020-09-06 ENCOUNTER — Inpatient Hospital Stay (HOSPITAL_COMMUNITY)
Admission: AD | Admit: 2020-09-06 | Discharge: 2020-09-08 | DRG: 807 | Disposition: A | Discharge status: Home / Self Care | Payer: Medicaid Other | Attending: Obstetrics and Gynecology | Admitting: Obstetrics and Gynecology

## 2020-09-06 VITALS — BP 94/55 | HR 85 | Wt 168.0 lb

## 2020-09-06 DIAGNOSIS — O364XX0 Maternal care for intrauterine death, not applicable or unspecified: Secondary | ICD-10-CM

## 2020-09-06 DIAGNOSIS — O9902 Anemia complicating childbirth: Secondary | ICD-10-CM | POA: Diagnosis not present

## 2020-09-06 DIAGNOSIS — Z3A30 30 weeks gestation of pregnancy: Secondary | ICD-10-CM

## 2020-09-06 DIAGNOSIS — D508 Other iron deficiency anemias: Secondary | ICD-10-CM | POA: Diagnosis present

## 2020-09-06 DIAGNOSIS — Z20822 Contact with and (suspected) exposure to covid-19: Secondary | ICD-10-CM | POA: Diagnosis not present

## 2020-09-06 DIAGNOSIS — Z87891 Personal history of nicotine dependence: Secondary | ICD-10-CM | POA: Diagnosis not present

## 2020-09-06 DIAGNOSIS — Z9889 Other specified postprocedural states: Secondary | ICD-10-CM | POA: Diagnosis not present

## 2020-09-06 DIAGNOSIS — Z3480 Encounter for supervision of other normal pregnancy, unspecified trimester: Secondary | ICD-10-CM

## 2020-09-06 LAB — CBC
HCT: 33 % — ABNORMAL LOW (ref 36.0–46.0)
Hemoglobin: 11 g/dL — ABNORMAL LOW (ref 12.0–15.0)
MCH: 32.9 pg (ref 26.0–34.0)
MCHC: 33.3 g/dL (ref 30.0–36.0)
MCV: 98.8 fL (ref 80.0–100.0)
Platelets: 222 10*3/uL (ref 150–400)
RBC: 3.34 MIL/uL — ABNORMAL LOW (ref 3.87–5.11)
RDW: 13.2 % (ref 11.5–15.5)
WBC: 9.1 10*3/uL (ref 4.0–10.5)
nRBC: 0 % (ref 0.0–0.2)

## 2020-09-06 LAB — TYPE AND SCREEN
ABO/RH(D): O POS
Antibody Screen: NEGATIVE

## 2020-09-06 LAB — RESP PANEL BY RT-PCR (FLU A&B, COVID) ARPGX2
Influenza A by PCR: NEGATIVE
Influenza B by PCR: NEGATIVE
SARS Coronavirus 2 by RT PCR: NEGATIVE

## 2020-09-06 LAB — RPR: RPR Ser Ql: NONREACTIVE

## 2020-09-06 MED ORDER — SOD CITRATE-CITRIC ACID 500-334 MG/5ML PO SOLN: 30.0000 mL | ORAL | Status: DC | PRN

## 2020-09-06 MED ORDER — ONDANSETRON HCL 4 MG/2ML IJ SOLN
4.0000 mg | Freq: Four times a day (QID) | INTRAMUSCULAR | Status: DC | PRN
  Administered 2020-09-07: 4 mg via INTRAVENOUS
  Filled 2020-09-06: qty 2

## 2020-09-06 MED ORDER — MISOPROSTOL 50MCG HALF TABLET
50.0000 ug | ORAL_TABLET | ORAL | Status: AC
  Administered 2020-09-06 (×2): 50 ug via BUCCAL
  Filled 2020-09-06 (×2): qty 1

## 2020-09-06 MED ORDER — ACETAMINOPHEN 325 MG PO TABS
650.0000 mg | ORAL_TABLET | ORAL | Status: DC | PRN
  Administered 2020-09-07: 650 mg via ORAL
  Filled 2020-09-06: qty 2

## 2020-09-06 MED ORDER — OXYTOCIN-SODIUM CHLORIDE 30-0.9 UT/500ML-% IV SOLN: 2.5000 [IU]/h | INTRAVENOUS | Status: DC

## 2020-09-06 MED ORDER — LACTATED RINGERS IV SOLN: INTRAVENOUS | Status: DC

## 2020-09-06 MED ORDER — LIDOCAINE HCL (PF) 1 % IJ SOLN: 30.0000 mL | INTRAMUSCULAR | Status: DC | PRN

## 2020-09-06 MED ORDER — MISOPROSTOL 25 MCG QUARTER TABLET
ORAL_TABLET | ORAL | Status: AC
  Administered 2020-09-06: 25 ug via VAGINAL
  Filled 2020-09-06: qty 1

## 2020-09-06 MED ORDER — OXYTOCIN BOLUS FROM INFUSION: 333.0000 mL | Freq: Once | INTRAVENOUS | Status: DC

## 2020-09-06 MED ORDER — LACTATED RINGERS IV SOLN: 500.0000 mL | INTRAVENOUS | Status: DC | PRN

## 2020-09-06 MED ORDER — FENTANYL CITRATE (PF) 100 MCG/2ML IJ SOLN
50.0000 ug | INTRAMUSCULAR | Status: DC | PRN
  Administered 2020-09-07: 50 ug via INTRAVENOUS
  Administered 2020-09-07 (×4): 100 ug via INTRAVENOUS
  Filled 2020-09-06 (×5): qty 2

## 2020-09-06 MED ORDER — MISOPROSTOL 25 MCG QUARTER TABLET: 25.0000 ug | ORAL_TABLET | Freq: Once | ORAL | Status: AC

## 2020-09-06 NOTE — Progress Notes (Signed)
Labor Progress Note Loretta Vazquez is a 19 y.o. G2P0010 at [redacted]w[redacted]d presented for IOL for IUFD  S: Not feeling any cramping or ctx   O:  BP 117/70   Pulse 86   Temp 98.4 F (36.9 C) (Oral)   Resp 16   Ht 5\' 6"  (1.676 m)   Wt 76.5 kg   LMP 02/08/2020 (Exact Date)   BMI 27.21 kg/m     A&P: 19 y.o. G2P0010 [redacted]w[redacted]d here for IOL for IUFD  #Labor: s/p cytotec x2 #Pain: pain meds, epidural prn    [redacted]w[redacted]d, MD 6:00 PM

## 2020-09-06 NOTE — Progress Notes (Signed)
   PRENATAL VISIT NOTE  Subjective:  Loretta Vazquez is a 19 y.o. G2P0010 at [redacted]w[redacted]d being seen today for ongoing prenatal care.  She is currently monitored for the following issues for this high-risk pregnancy and has Supervision of other normal pregnancy, antepartum and Iron deficiency anemia secondary to inadequate dietary iron intake on their problem list.  Patient reports decreased fetal movement.  Contractions: Not present. Vag. Bleeding: None.  Movement: Present. Denies leaking of fluid.   The following portions of the patient's history were reviewed and updated as appropriate: allergies, current medications, past family history, past medical history, past social history, past surgical history and problem list.   Objective:   Vitals:   09/06/20 0858  BP: (!) 94/55  Pulse: 85  Weight: 168 lb (76.2 kg)    Fetal Status:     Movement: Present     General:  Alert, oriented and cooperative. Patient is in no acute distress.  Skin: Skin is warm and dry. No rash noted.   Cardiovascular: Normal heart rate noted  Respiratory: Normal respiratory effort, no problems with respiration noted  Abdomen: Soft, gravid, appropriate for gestational age.  Pain/Pressure: Absent     Pelvic: Cervical exam deferred        Extremities: Normal range of motion.  Edema: None  Mental Status: Normal mood and affect. Normal behavior. Normal judgment and thought content.   Assessment and Plan:  Pregnancy: G2P0010 at [redacted]w[redacted]d  1. Fetal demise, greater than 22 weeks, antepartum, single or unspecified fetus [place] used to try to assess FHT - no cardiac activity. Additionally, no fluid around the baby. Likely cord accident - patient states that she hasn't been leaking fluid.   Will send patient down to [facility]. Discussed with my partner covering labor floor and charge nurse.   Preterm labor symptoms and general obstetric precautions including but not limited to vaginal bleeding, contractions, leaking of fluid and fetal  movement were reviewed in detail with the patient. Please refer to After Visit Summary for other counseling recommendations.   No follow-ups on file.  No future appointments.  Levie Heritage, DO

## 2020-09-06 NOTE — Progress Notes (Signed)
Labor Progress Note Loretta Vazquez is a 19 y.o. G2P0010 at [redacted]w[redacted]d presented for IOL for IUFD   S: Not feeling any cramping or ctx   O:  BP 126/68 (BP Location: Left Arm)   Pulse 81   Temp 98.7 F (37.1 C) (Oral)   Resp 17   Ht 5\' 6"  (1.676 m)   Wt 76.5 kg   LMP 02/08/2020 (Exact Date)   BMI 27.21 kg/m     A&P: 19 y.o. G2P0010 [redacted]w[redacted]d here for IOL for IUFD  #Labor: s/p cytotec x3. Discussed risk and benefits of FB placement. Will attempt FB at next check if cervix favorable.  #Pain: pain meds, epidural prn    [redacted]w[redacted]d, MD 9:13 PM

## 2020-09-06 NOTE — H&P (Signed)
OBSTETRIC ADMISSION HISTORY AND PHYSICAL  Loretta Vazquez is a 19 y.o. female G2P0010 with IUP at [redacted]w[redacted]d by LMP presenting for IOL for IUFD, diagnosed today in clinic. Patient reports noticing some decreased movement over past few days. Denies any leakage of fluid.   Reports no VB, no blurry vision, headaches or peripheral edema, and RUQ pain.    She received her prenatal care at [facility].    Sono:    @[redacted]w[redacted]d , CWD, normal anatomy, cephalic presentation, posterior placenta, 640g, 84% EFW   Prenatal History/Complications:  --chlamydia in second trimester, Neg TOC on 11/5  --Normocytic anemia   Past Medical History: History reviewed. No pertinent past medical history.  Past Surgical History: Past Surgical History:  Procedure Laterality Date  . DENTAL SURGERY      Obstetrical History: OB History    Gravida  2   Para      Term      Preterm      AB  1   Living        SAB  1   TAB      Ectopic      Multiple      Live Births              Social History Social History   Socioeconomic History  . Marital status: Single    Spouse name: Not on file  . Number of children: Not on file  . Years of education: Not on file  . Highest education level: Not on file  Occupational History  . Not on file  Tobacco Use  . Smoking status: Former Smoker    Packs/day: 0.00    Types: Cigars  . Smokeless tobacco: Never Used  Vaping Use  . Vaping Use: Never used  Substance and Sexual Activity  . Alcohol use: No  . Drug use: Not Currently    Types: Marijuana    Comment: Last smoked June 2021  . Sexual activity: Yes    Birth control/protection: None  Other Topics Concern  . Not on file  Social History Narrative  . Not on file   Social Determinants of Health   Financial Resource Strain:   . Difficulty of Paying Living Expenses: Not on file  Food Insecurity:   . Worried About July 2021 in the Last Year: Not on file  . Ran Out of Food in the Last Year:  Not on file  Transportation Needs:   . Lack of Transportation (Medical): Not on file  . Lack of Transportation (Non-Medical): Not on file  Physical Activity:   . Days of Exercise per Week: Not on file  . Minutes of Exercise per Session: Not on file  Stress:   . Feeling of Stress : Not on file  Social Connections:   . Frequency of Communication with Friends and Family: Not on file  . Frequency of Social Gatherings with Friends and Family: Not on file  . Attends Religious Services: Not on file  . Active Member of Clubs or Organizations: Not on file  . Attends Programme researcher, broadcasting/film/video Meetings: Not on file  . Marital Status: Not on file    Family History: History reviewed. No pertinent family history.  Allergies: No Known Allergies  Medications Prior to Admission  Medication Sig Dispense Refill Last Dose  . acetaminophen (TYLENOL) 500 MG tablet Take 2 tablets (1,000 mg total) by mouth every 6 (six) hours as needed. 30 tablet 0   . cyclobenzaprine (FLEXERIL) 10 MG tablet  Take 1 tablet (10 mg total) by mouth at bedtime. 20 tablet 0   . ferrous gluconate (FERGON) 324 MG tablet Take 1 tablet (324 mg total) by mouth daily with breakfast. 90 tablet 3   . ibuprofen (ADVIL) 600 MG tablet Take 1 tablet (600 mg total) by mouth every 6 (six) hours as needed for mild pain. 10 tablet 0   . Prenatal Vit-Iron Carbonyl-FA (KOSHER PRENATAL PLUS IRON) 30-1 MG TABS Take 1 tablet by mouth daily. 90 tablet 3      Review of Systems   All systems reviewed and negative except as stated in HPI  Height 5\' 6"  (1.676 m), weight 76.5 kg, last menstrual period 02/08/2020. General appearance: alert, cooperative and appears stated age Lungs: normal effort Abdomen: soft, non-tender; bowel sounds normal Pelvic: deferred Extremities: Homans sign is negative, no sign of DVT Psych: Flat affect  Presentation: cephalic     Prenatal labs: ABO, Rh: --/--/PENDING (12/02 1128) Antibody: PENDING (12/02  1128) Rubella: 6.20 (07/15 1448) RPR: Non Reactive (11/05 0903)  HBsAg: Negative (07/15 1448)  HIV: Non Reactive (11/05 0903)  GBS:    2 hr Glucola normal Genetic screening  Low risk Anatomy 06-29-1998 normal   Prenatal Transfer Tool  Maternal Diabetes: No Genetic Screening: Normal Maternal Ultrasounds/Referrals: Normal Fetal Ultrasounds or other Referrals:  None Maternal Substance Abuse:  No Significant Maternal Medications:  None Significant Maternal Lab Results: None  Results for orders placed or performed during the hospital encounter of 09/06/20 (from the past 24 hour(s))  Type and screen [facility]   Collection Time: 09/06/20 11:28 AM  Result Value Ref Range   ABO/RH(D) PENDING    Antibody Screen PENDING    Sample Expiration      09/09/2020,2359 Performed at [facility], [address] [place], [place]     Patient Active Problem List   Diagnosis Date Noted  . IUFD at 20 weeks or more of gestation 09/06/2020  . Iron deficiency anemia secondary to inadequate dietary iron intake 08/13/2020  . Supervision of other normal pregnancy, antepartum 04/19/2020    Assessment/Plan:  Loretta Vazquez is a 19 y.o. G2P0010 at [redacted]w[redacted]d here for IOL for IUFD. Confirmed per patient request by [redacted]w[redacted]d upon arrival.   #Induction of Labor:proceed with cytotec [place] buccal. Reassess in four hours.   #Pain: Pain meds, epidural prn.   #MOC:not discussed     , MD  09/06/2020, 12:20 PM

## 2020-09-06 NOTE — Progress Notes (Signed)
I met pt and her family.  They are still processing all that is happening and did not seem up for talking at this time.  I will check in with family tomorrow, but please page if there are needs over night.  [place], Bcc Pager, [phone] 4:55 PM

## 2020-09-07 DIAGNOSIS — O021 Missed abortion: Secondary | ICD-10-CM | POA: Diagnosis not present

## 2020-09-07 DIAGNOSIS — O364XX0 Maternal care for intrauterine death, not applicable or unspecified: Secondary | ICD-10-CM | POA: Diagnosis not present

## 2020-09-07 DIAGNOSIS — Z3A30 30 weeks gestation of pregnancy: Secondary | ICD-10-CM | POA: Diagnosis not present

## 2020-09-07 MED ORDER — SIMETHICONE 80 MG PO CHEW: 80.0000 mg | CHEWABLE_TABLET | ORAL | Status: DC | PRN

## 2020-09-07 MED ORDER — LACTATED RINGERS IV SOLN: 500.0000 mL | Freq: Once | INTRAVENOUS | Status: DC

## 2020-09-07 MED ORDER — ONDANSETRON HCL 4 MG PO TABS: 4.0000 mg | ORAL_TABLET | ORAL | Status: DC | PRN

## 2020-09-07 MED ORDER — DIPHENHYDRAMINE HCL 25 MG PO CAPS: 25.0000 mg | ORAL_CAPSULE | Freq: Four times a day (QID) | ORAL | Status: DC | PRN

## 2020-09-07 MED ORDER — PRENATAL MULTIVITAMIN CH
1.0000 | ORAL_TABLET | Freq: Every day | ORAL | Status: DC
  Administered 2020-09-08: 1 via ORAL
  Filled 2020-09-07: qty 1

## 2020-09-07 MED ORDER — PHENYLEPHRINE 40 MCG/ML (10ML) SYRINGE FOR IV PUSH (FOR BLOOD PRESSURE SUPPORT): 80.0000 ug | PREFILLED_SYRINGE | INTRAVENOUS | Status: DC | PRN

## 2020-09-07 MED ORDER — EPHEDRINE 5 MG/ML INJ: 10.0000 mg | INTRAVENOUS | Status: DC | PRN

## 2020-09-07 MED ORDER — ACETAMINOPHEN 325 MG PO TABS: 650.0000 mg | ORAL_TABLET | ORAL | Status: DC | PRN

## 2020-09-07 MED ORDER — DIBUCAINE (PERIANAL) 1 % EX OINT: 1.0000 "application " | TOPICAL_OINTMENT | CUTANEOUS | Status: DC | PRN

## 2020-09-07 MED ORDER — ONDANSETRON HCL 4 MG/2ML IJ SOLN: 4.0000 mg | INTRAMUSCULAR | Status: DC | PRN

## 2020-09-07 MED ORDER — MISOPROSTOL 50MCG HALF TABLET
ORAL_TABLET | ORAL | Status: AC
  Filled 2020-09-07: qty 2

## 2020-09-07 MED ORDER — SENNOSIDES-DOCUSATE SODIUM 8.6-50 MG PO TABS
2.0000 | ORAL_TABLET | ORAL | Status: DC
  Filled 2020-09-07: qty 2

## 2020-09-07 MED ORDER — TETANUS-DIPHTH-ACELL PERTUSSIS 5-2.5-18.5 LF-MCG/0.5 IM SUSY: 0.5000 mL | PREFILLED_SYRINGE | Freq: Once | INTRAMUSCULAR | Status: DC

## 2020-09-07 MED ORDER — FENTANYL-BUPIVACAINE-NACL 0.5-0.125-0.9 MG/250ML-% EP SOLN: 12.0000 mL/h | EPIDURAL | Status: DC | PRN

## 2020-09-07 MED ORDER — BENZOCAINE-MENTHOL 20-0.5 % EX AERO
1.0000 "application " | INHALATION_SPRAY | CUTANEOUS | Status: DC | PRN
  Administered 2020-09-08: 1 via TOPICAL
  Filled 2020-09-07: qty 56

## 2020-09-07 MED ORDER — OXYTOCIN-SODIUM CHLORIDE 30-0.9 UT/500ML-% IV SOLN
1.0000 m[IU]/min | INTRAVENOUS | Status: DC
  Administered 2020-09-07: 2 m[IU]/min via INTRAVENOUS
  Filled 2020-09-07: qty 500

## 2020-09-07 MED ORDER — COCONUT OIL OIL: 1.0000 "application " | TOPICAL_OIL | Status: DC | PRN

## 2020-09-07 MED ORDER — IBUPROFEN 600 MG PO TABS
600.0000 mg | ORAL_TABLET | Freq: Four times a day (QID) | ORAL | Status: DC
  Administered 2020-09-08 (×2): 600 mg via ORAL
  Filled 2020-09-07 (×3): qty 1

## 2020-09-07 MED ORDER — MISOPROSTOL 50MCG HALF TABLET
100.0000 ug | ORAL_TABLET | Freq: Once | ORAL | Status: AC
  Administered 2020-09-07: 100 ug via BUCCAL

## 2020-09-07 MED ORDER — MISOPROSTOL 50MCG HALF TABLET
100.0000 ug | ORAL_TABLET | ORAL | Status: DC
  Administered 2020-09-07 (×2): 100 ug via BUCCAL
  Filled 2020-09-07: qty 2

## 2020-09-07 MED ORDER — WITCH HAZEL-GLYCERIN EX PADS: 1.0000 "application " | MEDICATED_PAD | CUTANEOUS | Status: DC | PRN

## 2020-09-07 MED ORDER — DIPHENHYDRAMINE HCL 50 MG/ML IJ SOLN: 12.5000 mg | INTRAMUSCULAR | Status: DC | PRN

## 2020-09-07 MED ORDER — PROMETHAZINE HCL 25 MG/ML IJ SOLN
25.0000 mg | Freq: Four times a day (QID) | INTRAMUSCULAR | Status: DC | PRN
  Administered 2020-09-07: 25 mg via INTRAVENOUS
  Filled 2020-09-07: qty 1

## 2020-09-07 MED ORDER — MISOPROSTOL 25 MCG QUARTER TABLET
25.0000 ug | ORAL_TABLET | Freq: Once | ORAL | Status: AC
  Administered 2020-09-07: 25 ug via VAGINAL
  Filled 2020-09-07: qty 1

## 2020-09-07 NOTE — Progress Notes (Addendum)
Labor Progress Note Loretta Vazquez is a 19 y.o. G2P0010 at [redacted]w[redacted]d presented for IOL for IUFD   S: Feeling some cramping but tolerable. Able to sleep overnight.   O:  BP (!) 109/57   Pulse 93   Temp 99 F (37.2 C) (Oral)   Resp 18   Ht 5\' 6"  (1.676 m)   Wt 76.5 kg   LMP 02/08/2020 (Exact Date)   BMI 27.21 kg/m   CVE: 0/thick/high    A&P: 19 y.o. G2P0010 [redacted]w[redacted]d here for IOL for IUFD  #Labor: s/p cytotec x5. Have now increased dose to 100 mcg cytotec buccal. Will proceed w additional dose and recheck in four hours.   #Pain: pain meds, epidural prn    [redacted]w[redacted]d, MD 9:40 AM

## 2020-09-07 NOTE — Progress Notes (Signed)
Pt was resting and family stated no needs at this time.  I will continue to check in as I am able, but please also page as needs arise.  Chaplain Dyanne Carrel, Bcc Pager, [phone] 11:23 AM

## 2020-09-07 NOTE — Progress Notes (Signed)
Labor Progress Note Loretta Vazquez is a 19 y.o. G2P0010 at [redacted]w[redacted]d presented for IOL for IUFD   S: Feeling some intermittent contractions  O:  BP (!) 105/55 (BP Location: Left Arm)   Pulse 79   Temp 98.6 F (37 C) (Oral)   Resp 17   Ht 5\' 6"  (1.676 m)   Wt 76.5 kg   LMP 02/08/2020 (Exact Date)   BMI 27.21 kg/m     A&P: 19 y.o. G2P0010 [redacted]w[redacted]d here for IOL for IUFD  #Labor: s/p cytotec x3. Will give 100 mcg dose of cytotec buccal. Discussed risk and benefits of FB placement. Will attempt FB at next check if cervix favorable.  #Pain: pain meds, epidural prn    [redacted]w[redacted]d, MD 1:14 AM

## 2020-09-07 NOTE — Discharge Summary (Signed)
Postpartum Discharge Summary     Patient Name: Loretta Vazquez DOB: Apr 29, 2001 MRN: [id number]  Date of admission: 09/06/2020 Delivery date:09/07/2020  Delivering provider: Janet Berlin  Date of discharge: 09/08/2020  Admitting diagnosis: IUFD dx at prenatal visit Intrauterine pregnancy: [redacted]w[redacted]d    Secondary diagnosis:  None  Discharge diagnosis: s/p vaginal delivery Post partum procedures: none  Augmentation: Pitocin, Cytotec and IP Foley Complications: None  Hospital course: Induction of Labor With Vaginal Delivery   19y.o. yo G2P0010 at 340w2das admitted to the hospital 09/06/2020 for induction of labor.  Indication for induction: IUFD.  Patient had an uncomplicated labor course as follows: She desired d/c to home on PPD#1 and was deemd stable for discharge.  Membrane Rupture Time/Date:  ,   Delivery Method:Vaginal, Spontaneous  Episiotomy: None  Lacerations:   None Details of delivery can be found in separate delivery note.  Patient had a routine postpartum course. Patient is discharged home 09/08/20.  Newborn Data: Birth date:09/07/2020  Birth time:8:06 PM  Gender:Female  Living status:Fetal Demise  Apgars:0/0 Weight:Not entered Magnesium Sulfate received: No BMZ received: No Rhophylac:N/A MMR:N/A T-DaP:Given prenatally Flu: No Transfusion:No  Physical exam  Per Dr. DaRosana Hoeslood pressure (!) 99/56, pulse 80, temperature 98.3 F (36.8 C), temperature source Oral, resp. rate 16, height 5' 6"  (1.676 m), weight 76.5 kg, last menstrual period 02/08/2020, SpO2 99 %. Temp:  [97.6 F (36.4 C)-99 F (37.2 C)] 98.3 F (36.8 C) (12/04 0407) Pulse Rate:  [69-93] 80 (12/04 0407) Resp:  [16-18] 16 (12/04 0407) BP: (99-126)/(47-78) 99/56 (12/04 0407) SpO2:  [98 %-100 %] 99 % (12/04 0407)  Physical Exam:  General: alert, oriented, cooperative Chest: normal respiratory effort Heart: RRR  Abdomen: soft, appropriately tender to palpation  Uterine Fundus: firm, 2  fingers below the umbilicus Lochia: moderate, rubra DVT Evaluation: no evidence of DVT Extremities: no edema, no calf tenderness  UOP: voiding spontaneously Labs: Lab Results  Component Value Date   WBC 9.1 09/06/2020   HGB 11.0 (L) 09/06/2020   HCT 33.0 (L) 09/06/2020   MCV 98.8 09/06/2020   PLT 222 09/06/2020   No flowsheet data found. Edinburgh Score: No flowsheet data found.   After visit meds:  Allergies as of 09/08/2020   No Known Allergies     Medication List    STOP taking these medications   cyclobenzaprine 10 MG tablet Commonly known as: FLEXERIL   ferrous gluconate 324 MG tablet Commonly known as: FERGON     TAKE these medications   acetaminophen 500 MG tablet Commonly known as: TYLENOL Take 2 tablets (1,000 mg total) by mouth every 6 (six) hours as needed.   diphenhydrAMINE 25 mg capsule Commonly known as: BENADRYL Take 1 capsule (25 mg total) by mouth at bedtime as needed for sleep.   ibuprofen 600 MG tablet Commonly known as: ADVIL Take 1 tablet (600 mg total) by mouth every 6 (six) hours as needed for mild pain.   Kosher Prenatal Plus Iron 30-1 MG Tabs Take 1 tablet by mouth daily.        Discharge home in stable condition Infant DiStoneischarge instruction: per After Visit Summary and Postpartum booklet. Activity: Advance as tolerated. Pelvic rest for 6 weeks.  Diet: routine diet Future Appointments: Future Appointments  Date Time Provider [place]12/16/2021  9:15 AM StTruett MainlandDO [facility] None   Follow up Visit:  [place].  Go to.   Specialty: Obstetrics and Gynecology Why: the office will call you in a few days to schedule a 7-10 day follow up visit with you Contact information: [place] [address] [phone]       Vesta Mixer C, LCSW Follow up.   Specialty: Licensed  Clinical Social Worker Why: Loretta Vazquez will call you in a few days to check up on you to see how your mood is.                Please schedule this patient for a In person postpartum visit in 4 weeks with the following provider: Any provider. Additional Postpartum F/U:Postpartum Depression checkup. Request sent for one week  Low risk pregnancy complicated by: IUFD at 30 weeks  Delivery mode:  Vaginal, Spontaneous  Anticipated Birth Control:  Unsure   09/08/2020 Aletha Halim, MD

## 2020-09-07 NOTE — Progress Notes (Signed)
Labor Progress Note Loretta Vazquez is a 19 y.o. G2P0010 at [redacted]w[redacted]d presented for IOL for IUFD   S: Feeling very nauseous   O:  BP 118/61   Pulse 77   Temp 99 F (37.2 C)   Resp 16   Ht 5\' 6"  (1.676 m)   Wt 76.5 kg   LMP 02/08/2020 (Exact Date)   BMI 27.21 kg/m   CVE: 4/70/-3    A&P: 19 y.o. G2P0010 [redacted]w[redacted]d here for IOL for IUFD  #Labor: s/p cytotec x7. S/p FB. Pitocin started at 1821, running at 2   #IUFD: re-discussed with patient - she desires autopsy & genetic testing   #Pain: pain meds, epidural prn    [redacted]w[redacted]d, MD 7:03 PM

## 2020-09-07 NOTE — Progress Notes (Signed)
Labor Progress Note Loretta Vazquez is a 19 y.o. G2P0010 at [redacted]w[redacted]d presented for IOL for IUFD   S: Feeling some intermittent contractions.  O:  BP 121/70 (BP Location: Left Arm)   Pulse 85   Temp 99 F (37.2 C) (Oral)   Resp 18   Ht 5\' 6"  (1.676 m)   Wt 76.5 kg   LMP 02/08/2020 (Exact Date)   BMI 27.21 kg/m     A&P: 19 y.o. G2P0010 [redacted]w[redacted]d here for IOL for IUFD  #Labor: s/p cytotec x4. Based on SVE, will consider FB placement.  #Pain: pain meds, epidural prn    [redacted]w[redacted]d, MD 5:51 AM

## 2020-09-07 NOTE — Progress Notes (Signed)
Labor Progress Note Loretta Vazquez is a 19 y.o. G2P0010 at [redacted]w[redacted]d presented for IOL for IUFD   S: Pain has increased w contractions.   O:  BP 121/71    Pulse 92    Temp 99 F (37.2 C) (Oral)    Resp 16    Ht 5\' 6"  (1.676 m)    Wt 76.5 kg    LMP 02/08/2020 (Exact Date)    BMI 27.21 kg/m   CVE: 1/50/-3  FB placed, notable for return of bright red blood with placement.   A&P: 19 y.o. G2P0010 [redacted]w[redacted]d here for IOL for IUFD  #Labor: s/p cytotec x6. FB placed, proceed with additional cytotec, consider transition to pitocin at next check.   #Pain: pain meds, epidural prn    [redacted]w[redacted]d, MD 2:12 PM

## 2020-09-08 ENCOUNTER — Encounter (HOSPITAL_COMMUNITY): Payer: Self-pay | Admitting: Obstetrics and Gynecology

## 2020-09-08 DIAGNOSIS — Z9889 Other specified postprocedural states: Secondary | ICD-10-CM

## 2020-09-08 MED ORDER — DIPHENHYDRAMINE HCL 25 MG PO CAPS
25.0000 mg | ORAL_CAPSULE | Freq: Every evening | ORAL | 0 refills | Status: DC | PRN
Start: 2020-09-08 — End: 2021-04-02

## 2020-09-08 NOTE — Progress Notes (Signed)
Faculty Attending Note  Post Partum Day 1  Subjective: Patient is feeling okay. She reports well controlled pain on PO pain meds. She is ambulating and denies light-headedness or dizziness. Has not attempted to eat or drink yet. Denies nausea/vomiting. Bleeding is moderate.   Objective: Blood pressure (!) 99/56, pulse 80, temperature 98.3 F (36.8 C), temperature source Oral, resp. rate 16, height 5\' 6"  (1.676 m), weight 76.5 kg, last menstrual period 02/08/2020, SpO2 99 %. Temp:  [97.6 F (36.4 C)-99 F (37.2 C)] 98.3 F (36.8 C) (12/04 0407) Pulse Rate:  [69-93] 80 (12/04 0407) Resp:  [16-18] 16 (12/04 0407) BP: (99-126)/(47-78) 99/56 (12/04 0407) SpO2:  [98 %-100 %] 99 % (12/04 0407)  Physical Exam:  General: alert, oriented, cooperative Chest: normal respiratory effort Heart: RRR  Abdomen: soft, appropriately tender to palpation  Uterine Fundus: firm, 2 fingers below the umbilicus Lochia: moderate, rubra DVT Evaluation: no evidence of DVT Extremities: no edema, no calf tenderness  UOP: voiding spontaneously  Recent Labs    09/06/20 1109  HGB 11.0*  HCT 33.0*    Assessment/Plan: Patient Active Problem List   Diagnosis Date Noted  . IUFD at 20 weeks or more of gestation 09/06/2020  . Iron deficiency anemia secondary to inadequate dietary iron intake 08/13/2020  . Supervision of other normal pregnancy, antepartum 04/19/2020    Patient is 19 y.o. 12 PPD#1 s/p SVD at [redacted]w[redacted]d, s/p IOL for intra-uterine fetal demise. She is doing okay, recovering appropriately. Baby at bedside on cooling blanket. Reviewed that as long as she is able to eat/drink, she may be discharged later today if she desires. Will have SW see her, offered behavioral health.   Continue routine post partum care Pain meds prn Regular diet Did not discuss for birth control Plan for discharge possibly later today SW consult   K. [redacted]w[redacted]d, M.D. [facility]ending [facility] (Faculty  Practice)  09/08/2020, 8:13 AM

## 2020-09-08 NOTE — Progress Notes (Signed)
CSW received consult for intrauterine fetal demise.  CSW met with MOB and FOB to offer support and complete assessment. On arrival, CSW introduced self and offered condolences. Both parents stated they were processing loss and have support of family. They stated funeral home has already been selected and is located in [facility]. CSW provided grief and loss counseling options and provided psychoeducation on stages of grief.  Parents were receptive and appropriate in mood and affect.   CSW provided education regarding the baby blues period vs. perinatal mood disorders, discussed treatment and gave resources for mental health follow up if concerns arise.  CSW recommends self-evaluation during the postpartum time period using the Checklist from Postpartum Progress and encouraged MOB to contact a medical professional if symptoms are noted at any time.     CSW identifies no further need for intervention and no barriers to discharge at this time.  Georgiann Neider D. Lissa Morales, MSW, [place] Clinical Social Worker [phone]

## 2020-09-08 NOTE — Discharge Instructions (Signed)
Depression   What are the signs or symptoms? Symptoms of this condition include:  Brief changes in mood, such as going from extreme happiness to sadness.  Decreased concentration.  Difficulty sleeping.  Crying spells and tearfulness.  Loss of appetite.  Irritability.  Anxiety.  How is this diagnosed? This condition is diagnosed based on an evaluation of your symptoms. There are no medical or lab tests that lead to a diagnosis, but there are various questionnaires that a health care provider may use to identify women with the baby blues or postpartum depression. How is this treated? Treatment is not needed for this condition. The depression usually go away on their own in 1-2 weeks. Social support is often all that is needed. You will be encouraged to get adequate sleep and rest.  Follow these instructions at home: Lifestyle      Get as much rest as you can.   Exercise regularly as told by your health care provider. Some women find yoga and walking to be helpful.  Eat a balanced and nourishing diet. This includes plenty of fruits and vegetables, whole grains, and lean proteins.  Do little things that you enjoy. Have a cup of tea, take a bubble bath, read your favorite magazine, or listen to your favorite music.  Avoid alcohol. Ask for help with household chores, cooking, grocery shopping, or running errands. Do not try to do everything yourself.   General instructions  Talk to people close to you about how you are feeling. Get support from your partner, family members, friends. You may want to join a support group.  Find ways to cope with stress. This may include: ? Writing your thoughts and feelings in a journal. ? Spending time outside. ? Spending time with people who make you laugh.  Try to stay positive in how you think. Think about the things you are grateful for.  Take over-the-counter and prescription medicines only as told by your health care  provider.  Let your health care provider know if you have any concerns.  Keep all postpartum visits as told by your health care provider. This is important. Contact a health care provider if:  Your depression do not go away after 2 weeks. Get help right away if:  You have thoughts of taking your own life (suicidal thoughts).  You think you may harm other people.  You see or hear things that are not there (hallucinations).    Vaginal Delivery, Care After Refer to this sheet in the next few weeks. These discharge instructions provide you with information on caring for yourself after delivery. Your caregiver may also give you specific instructions. Your treatment has been planned according to the most current medical practices available, but problems sometimes occur. Call your caregiver if you have any problems or questions after you go home. HOME CARE INSTRUCTIONS 1. Take over-the-counter or prescription medicines only as directed by your caregiver or pharmacist. 2. Do not drink alcohol, especially if you are breastfeeding or taking medicine to relieve pain. 3. Do not smoke tobacco. 4. Continue to use good perineal care. Good perineal care includes: 1. Wiping your perineum from back to front 2. Keeping your perineum clean. 3. You can do sitz baths twice a day, to help keep this area clean 5. Do not use tampons, douche or have sex until your caregiver says it is okay. 6. Shower only and avoid sitting in submerged water, aside from sitz baths 7. Wear a well-fitting bra that provides breast support. 8.  Eat healthy foods. 9. Drink enough fluids to keep your urine clear or pale yellow. 10. Eat high-fiber foods such as whole grain cereals and breads, brown rice, beans, and fresh fruits and vegetables every day. These foods may help prevent or relieve constipation. 11. Avoid constipation with high fiber foods or medications, such as miralax or metamucil 12. Follow your caregiver's  recommendations regarding resumption of activities such as climbing stairs, driving, lifting, exercising, or traveling. 13. Talk to your caregiver about resuming sexual activities. Resumption of sexual activities is dependent upon your risk of infection, your rate of healing, and your comfort and desire to resume sexual activity. 14. Try to have someone help you with your household activities and your newborn for at least a few days after you leave the hospital. 15. Rest as much as possible. Try to rest or take a nap when your newborn is sleeping. 16. Increase your activities gradually. 17. Keep all of your scheduled postpartum appointments. It is very important to keep your scheduled follow-up appointments. At these appointments, your caregiver will be checking to make sure that you are healing physically and emotionally. SEEK MEDICAL CARE IF:   You are passing large clots from your vagina. Save any clots to show your caregiver.  You have a foul smelling discharge from your vagina.  You have trouble urinating.  You are urinating frequently.  You have pain when you urinate.  You have a change in your bowel movements.  You have increasing redness, pain, or swelling near your vaginal incision (episiotomy) or vaginal tear.  You have pus draining from your episiotomy or vaginal tear.  Your episiotomy or vaginal tear is separating.  You have painful, hard, or reddened breasts.  You have a severe headache.  You have blurred vision or see spots.  You feel sad or depressed.  You have thoughts of hurting yourself or your newborn.  You have questions about your care, the care of your newborn, or medicines.  You are dizzy or light-headed.  You have a rash.  You have nausea or vomiting.  You were breastfeeding and have not had a menstrual period within 12 weeks after you stopped breastfeeding.  You are not breastfeeding and have not had a menstrual period by the 12th week after  delivery.  You have a fever. SEEK IMMEDIATE MEDICAL CARE IF:   You have persistent pain.  You have chest pain.  You have shortness of breath.  You faint.  You have leg pain.  You have stomach pain.  Your vaginal bleeding saturates two or more sanitary pads in 1 hour. MAKE SURE YOU:   Understand these instructions.  Will watch your condition.  Will get help right away if you are not doing well or get worse. Document Released: 09/19/2000 Document Revised: 02/06/2014 Document Reviewed: 05/19/2012 [facility] Patient Information 2015 [place], [place]. This information is not intended to replace advice given to you by your health care provider. Make sure you discuss any questions you have with your health care provider.  Sitz Bath A sitz bath is a warm water bath taken in the sitting position. The water covers only the hips and butt (buttocks). We recommend using one that fits in the toilet, to help with ease of use and cleanliness. It may be used for either healing or cleaning purposes. Sitz baths are also used to relieve pain, itching, or muscle tightening (spasms). The water may contain medicine. Moist heat will help you heal and relax.  HOME CARE  Take  3 to 4 sitz baths a day. 18. Fill the bathtub half-full with warm water. 19. Sit in the water and open the drain a little. 20. Turn on the warm water to keep the tub half-full. Keep the water running constantly. 21. Soak in the water for 15 to 20 minutes. 22. After the sitz bath, pat the affected area dry. GET HELP RIGHT AWAY IF: You get worse instead of better. Stop the sitz baths if you get worse. MAKE SURE YOU:  Understand these instructions.  Will watch your condition.  Will get help right away if you are not doing well or get worse. Document Released: 10/30/2004 Document Revised: 06/16/2012 Document Reviewed: 01/20/2011 [facility] Patient Information 2015 [place], [place]. This information is not intended to replace advice  given to you by your health care provider. Make sure you discuss any questions you have with your health care provider.

## 2020-09-08 NOTE — Progress Notes (Signed)
   09/08/20 1400  Clinical Encounter Type  Visited With Patient and family together  Visit Type Follow-up  Referral From Nurse  Consult/Referral To Chaplain  Spiritual Encounters  Spiritual Needs Emotional  Stress Factors  Patient Stress Factors Exhausted;Loss  Family Stress Factors Loss  Chaplain visited at the request of patient's nurse. Patient was being released after 20-week fetal demise. Chaplain spoke with patient and child's father. They did not desire visit from chaplain.

## 2020-09-10 ENCOUNTER — Telehealth: Payer: Self-pay | Admitting: Clinical

## 2020-09-10 NOTE — Telephone Encounter (Signed)
Follow up call, as requested by medical provider for brief check after loss; pt feels she is coping well at this time with good support at home; is sleeping well and appetite has returned. Pt will continue taking prenatal vitamin until medical visit on 09/20/20, is open to receive additional grief resources via MyChart message, as well as [facility] referral.

## 2020-09-11 ENCOUNTER — Encounter (HOSPITAL_COMMUNITY): Payer: Self-pay | Admitting: Obstetrics and Gynecology

## 2020-09-11 LAB — SURGICAL PATHOLOGY

## 2020-09-20 ENCOUNTER — Encounter: Payer: Self-pay | Admitting: Family Medicine

## 2020-09-20 ENCOUNTER — Ambulatory Visit (INDEPENDENT_AMBULATORY_CARE_PROVIDER_SITE_OTHER): Payer: Medicaid Other | Admitting: Family Medicine

## 2020-09-20 ENCOUNTER — Other Ambulatory Visit: Payer: Self-pay

## 2020-09-20 VITALS — BP 118/70 | HR 70 | Ht 66.0 in | Wt 163.0 lb

## 2020-09-20 DIAGNOSIS — O364XX0 Maternal care for intrauterine death, not applicable or unspecified: Secondary | ICD-10-CM

## 2020-09-20 NOTE — Progress Notes (Signed)
° °  Subjective:    Patient ID: Loretta Vazquez, female    DOB: March 06, 2001, 19 y.o.   MRN: [id number]  HPI  Patient seen for a 2 week postpartum check after a 30 week demise. Mood has been appropriate - grieving still. No indication at birth of reasons of fetal demise. Bleeding is light. No problems with breasts. Has good support.  Review of Systems     Objective:   Physical Exam Vitals reviewed.  Constitutional:      Appearance: Normal appearance.  Cardiovascular:     Rate and Rhythm: Normal rate.     Pulses: Normal pulses.  Pulmonary:     Effort: Pulmonary effort is normal.  Neurological:     General: No focal deficit present.     Mental Status: She is alert.  Psychiatric:        Mood and Affect: Mood normal.        Behavior: Behavior normal.        Thought Content: Thought content normal.        Judgment: Judgment normal.       Assessment & Plan:  1. IUFD at 20 weeks or more of gestation Appropriate grieving. F/u in 2 weeks.

## 2020-10-06 NOTE — L&D Delivery Note (Signed)
Called by nursing to let me know baby an placenta delivered in tact. Placenta is in bucket. Infant without signs of life. Appears to be female. No bleeding.

## 2021-01-05 ENCOUNTER — Other Ambulatory Visit (HOSPITAL_BASED_OUTPATIENT_CLINIC_OR_DEPARTMENT_OTHER): Payer: Self-pay

## 2021-03-17 ENCOUNTER — Other Ambulatory Visit: Payer: Self-pay

## 2021-03-17 ENCOUNTER — Ambulatory Visit
Admission: EM | Admit: 2021-03-17 | Discharge: 2021-03-17 | Disposition: A | Discharge status: Other Acute Inpatient Hospital | Payer: Medicaid Other | Attending: Emergency Medicine | Admitting: Emergency Medicine

## 2021-03-17 ENCOUNTER — Encounter: Payer: Self-pay | Admitting: Emergency Medicine

## 2021-03-17 DIAGNOSIS — Z3A00 Weeks of gestation of pregnancy not specified: Secondary | ICD-10-CM | POA: Insufficient documentation

## 2021-03-17 DIAGNOSIS — Z3201 Encounter for pregnancy test, result positive: Secondary | ICD-10-CM | POA: Diagnosis not present

## 2021-03-17 DIAGNOSIS — N938 Other specified abnormal uterine and vaginal bleeding: Secondary | ICD-10-CM | POA: Diagnosis not present

## 2021-03-17 DIAGNOSIS — O4690 Antepartum hemorrhage, unspecified, unspecified trimester: Secondary | ICD-10-CM

## 2021-03-17 DIAGNOSIS — R109 Unspecified abdominal pain: Secondary | ICD-10-CM | POA: Diagnosis not present

## 2021-03-17 DIAGNOSIS — O209 Hemorrhage in early pregnancy, unspecified: Secondary | ICD-10-CM | POA: Diagnosis not present

## 2021-03-17 DIAGNOSIS — Z3A01 Less than 8 weeks gestation of pregnancy: Secondary | ICD-10-CM | POA: Diagnosis not present

## 2021-03-17 LAB — POCT URINALYSIS DIP (MANUAL ENTRY)
Bilirubin, UA: NEGATIVE
Blood, UA: NEGATIVE
Glucose, UA: NEGATIVE mg/dL
Ketones, POC UA: NEGATIVE mg/dL
Leukocytes, UA: NEGATIVE
Nitrite, UA: NEGATIVE
Protein Ur, POC: NEGATIVE mg/dL
Spec Grav, UA: 1.02 (ref 1.010–1.025)
Urobilinogen, UA: 0.2 E.U./dL
pH, UA: 7 (ref 5.0–8.0)

## 2021-03-17 LAB — POCT URINE PREGNANCY: Preg Test, Ur: POSITIVE — AB

## 2021-03-17 NOTE — ED Notes (Signed)
Pt with positive pregnancy test with some bleeding; pt to go for further eval at MAU; pt verbalized understanding

## 2021-03-17 NOTE — ED Triage Notes (Signed)
Pt here for STD testing and sts some spotting with no period since March

## 2021-03-18 LAB — CERVICOVAGINAL ANCILLARY ONLY
Bacterial Vaginitis (gardnerella): POSITIVE — AB
Candida Glabrata: NEGATIVE
Candida Vaginitis: NEGATIVE
Chlamydia: NEGATIVE
Comment: NEGATIVE
Comment: NEGATIVE
Comment: NEGATIVE
Comment: NEGATIVE
Comment: NEGATIVE
Comment: NORMAL
Neisseria Gonorrhea: NEGATIVE
Trichomonas: NEGATIVE

## 2021-03-21 ENCOUNTER — Other Ambulatory Visit (HOSPITAL_BASED_OUTPATIENT_CLINIC_OR_DEPARTMENT_OTHER): Payer: Self-pay

## 2021-03-21 ENCOUNTER — Telehealth (HOSPITAL_COMMUNITY): Payer: Self-pay | Admitting: Emergency Medicine

## 2021-03-21 MED ORDER — METRONIDAZOLE 0.75 % VA GEL
1.0000 | Freq: Every day | VAGINAL | 0 refills | Status: AC
  Filled 2021-03-21: qty 70, 5d supply, fill #0

## 2021-04-02 ENCOUNTER — Other Ambulatory Visit: Payer: Self-pay

## 2021-04-02 ENCOUNTER — Encounter (HOSPITAL_BASED_OUTPATIENT_CLINIC_OR_DEPARTMENT_OTHER): Payer: Self-pay | Admitting: Emergency Medicine

## 2021-04-02 ENCOUNTER — Inpatient Hospital Stay (HOSPITAL_BASED_OUTPATIENT_CLINIC_OR_DEPARTMENT_OTHER)
Admission: EM | Admit: 2021-04-02 | Discharge: 2021-04-02 | Disposition: A | Discharge status: Home / Self Care | Payer: Medicaid Other | Attending: Obstetrics & Gynecology | Admitting: Obstetrics & Gynecology

## 2021-04-02 ENCOUNTER — Inpatient Hospital Stay (HOSPITAL_COMMUNITY): Payer: Medicaid Other

## 2021-04-02 ENCOUNTER — Emergency Department (HOSPITAL_BASED_OUTPATIENT_CLINIC_OR_DEPARTMENT_OTHER): Payer: Medicaid Other

## 2021-04-02 ENCOUNTER — Inpatient Hospital Stay (HOSPITAL_BASED_OUTPATIENT_CLINIC_OR_DEPARTMENT_OTHER): Payer: Medicaid Other

## 2021-04-02 DIAGNOSIS — O42912 Preterm premature rupture of membranes, unspecified as to length of time between rupture and onset of labor, second trimester: Secondary | ICD-10-CM

## 2021-04-02 DIAGNOSIS — O336XX1 Maternal care for disproportion due to hydrocephalic fetus, fetus 1: Secondary | ICD-10-CM | POA: Diagnosis not present

## 2021-04-02 DIAGNOSIS — O358XX0 Maternal care for other (suspected) fetal abnormality and damage, not applicable or unspecified: Secondary | ICD-10-CM | POA: Diagnosis not present

## 2021-04-02 DIAGNOSIS — Z87891 Personal history of nicotine dependence: Secondary | ICD-10-CM | POA: Diagnosis not present

## 2021-04-02 DIAGNOSIS — O402XX0 Polyhydramnios, second trimester, not applicable or unspecified: Secondary | ICD-10-CM | POA: Diagnosis not present

## 2021-04-02 DIAGNOSIS — Z3A18 18 weeks gestation of pregnancy: Secondary | ICD-10-CM | POA: Insufficient documentation

## 2021-04-02 DIAGNOSIS — O034 Incomplete spontaneous abortion without complication: Secondary | ICD-10-CM

## 2021-04-02 DIAGNOSIS — O4102X0 Oligohydramnios, second trimester, not applicable or unspecified: Secondary | ICD-10-CM

## 2021-04-02 DIAGNOSIS — O321XX0 Maternal care for breech presentation, not applicable or unspecified: Secondary | ICD-10-CM

## 2021-04-02 DIAGNOSIS — O350XX0 Maternal care for (suspected) central nervous system malformation in fetus, not applicable or unspecified: Secondary | ICD-10-CM | POA: Insufficient documentation

## 2021-04-02 DIAGNOSIS — O4100X0 Oligohydramnios, unspecified trimester, not applicable or unspecified: Secondary | ICD-10-CM

## 2021-04-02 DIAGNOSIS — N898 Other specified noninflammatory disorders of vagina: Secondary | ICD-10-CM | POA: Diagnosis present

## 2021-04-02 DIAGNOSIS — O3509X0 Maternal care for (suspected) other central nervous system malformation or damage in fetus, not applicable or unspecified: Secondary | ICD-10-CM

## 2021-04-02 LAB — CBC WITH DIFFERENTIAL/PLATELET
Abs Immature Granulocytes: 0.07 10*3/uL (ref 0.00–0.07)
Basophils Absolute: 0 10*3/uL (ref 0.0–0.1)
Basophils Relative: 1 %
Eosinophils Absolute: 0.1 10*3/uL (ref 0.0–0.5)
Eosinophils Relative: 1 %
HCT: 34.2 % — ABNORMAL LOW (ref 36.0–46.0)
Hemoglobin: 11.7 g/dL — ABNORMAL LOW (ref 12.0–15.0)
Immature Granulocytes: 1 %
Lymphocytes Relative: 21 %
Lymphs Abs: 1.8 10*3/uL (ref 0.7–4.0)
MCH: 33.1 pg (ref 26.0–34.0)
MCHC: 34.2 g/dL (ref 30.0–36.0)
MCV: 96.6 fL (ref 80.0–100.0)
Monocytes Absolute: 0.5 10*3/uL (ref 0.1–1.0)
Monocytes Relative: 6 %
Neutro Abs: 6 10*3/uL (ref 1.7–7.7)
Neutrophils Relative %: 70 %
Platelets: 224 10*3/uL (ref 150–400)
RBC: 3.54 MIL/uL — ABNORMAL LOW (ref 3.87–5.11)
RDW: 13 % (ref 11.5–15.5)
WBC: 8.5 10*3/uL (ref 4.0–10.5)
nRBC: 0 % (ref 0.0–0.2)

## 2021-04-02 LAB — WET PREP, GENITAL
Clue Cells Wet Prep HPF POC: NONE SEEN
Sperm: NONE SEEN
Trich, Wet Prep: NONE SEEN
Yeast Wet Prep HPF POC: NONE SEEN

## 2021-04-02 LAB — HCG, QUANTITATIVE, PREGNANCY: hCG, Beta Chain, Quant, S: 3880 m[IU]/mL — ABNORMAL HIGH (ref <5)

## 2021-04-02 NOTE — MAU Note (Signed)
Loretta Vazquez is a 20 y.o. here in MAU reporting: was sent [place] from [place] due to ROM. States she is here for counseling. Is continuing to leak. Denies bleeding and pain.  Onset of complaint: a month  Pain score: 0/10  Vitals:   04/02/21 1147 04/02/21 1438  BP: 120/80 113/65  Pulse: 65 64  Resp: 18 16  Temp:  98.8 F (37.1 C)  SpO2: 100% 99%     Lab orders placed from triage: none

## 2021-04-02 NOTE — Progress Notes (Signed)
OB ANTEPARTUM NOTE  At bedside to discuss management.  Pt was seen at [facility] where they had confirmed PPROM @ [redacted]w[redacted]d.  She currently denies vaginal bleeding, pelvic or abdominal pain.  No fever or chills.  No acute complaints.  She is sitting comfortably.  Sister is with her at bedside.  [place] report reviewed: Breech fetus with evidence of mild/borderline fetal ventrigulomegaly.  FHT present 153.  Anhydramnios with near complete lack of amniotic fluid.  Cervix appears open with length of 2.75cm   Diagnosis discussed with patient and her sister.  Patient given options of expectant management or proceeding with delivery (IOL vs D&E).  Discussed risks of increased risks of infection (chorioamnionitis, endometritis), abruptio placentae, cord prolapse, fetal death and preterm birth. Also emphasized that the neonate is at risk for morbidity/mortality related to preterm birth, infection, pulmonary hypoplasia, and musculoskeletal deformation.  Discussed expectant management which would include watching for infection or delivery over the next several weeks.  If she remains pregnant, would then at 23 be admitted for inpatient observation, steroids and antibiotics.  Discussed that at this early gestation, medically that is not indicated as she is currently pre-viable.   Questions and concerns were addressed, she is leaning toward induction.  She does wish to wait and return on Friday.  If she currently does not show signs of infection or preterm labor will plan for discharge home with follow up as discussed for Friday.  She was told to call/or return to MAU for any signs/symptoms of infection or bleeding; chorioamnionitis or abruption as these would be indications for delivery.   Myna Hidalgo, DO Attending Obstetrician & Gynecologist, [facility] for [employer], [facility]

## 2021-04-02 NOTE — ED Triage Notes (Signed)
Patient complains of vaginal discharge with scant blood. Had a positive preg 03/17/21.

## 2021-04-02 NOTE — MAU Provider Note (Signed)
History     CSN: [id number]  Arrival date and time: 04/02/21 0700   Event Date/Time   First Provider Initiated Contact with Patient 04/02/21 1626      Chief Complaint  Patient presents with   Vaginal Discharge   Loretta Vazquez is a 20 y.o. G3P0110 at [redacted]w[redacted]d.  She presents today as a transfer from [facility] after evaluation shows anahydramnios.  Patient denies pain or vaginal bleeding.       OB History     Gravida  3   Para  1   Term      Preterm  1   AB  1   Living         SAB  1   IAB      Ectopic      Multiple  0   Live Births              History reviewed. No pertinent past medical history.  Past Surgical History:  Procedure Laterality Date   DENTAL SURGERY      History reviewed. No pertinent family history.  Social History   Tobacco Use   Smoking status: Former    Packs/day: 0.00    Pack years: 0.00    Types: Cigars, Cigarettes   Smokeless tobacco: Never  Vaping Use   Vaping Use: Never used  Substance Use Topics   Alcohol use: No   Drug use: Not Currently    Types: Marijuana    Comment: Last smoked June 2021    Allergies: No Known Allergies  Medications Prior to Admission  Medication Sig Dispense Refill Last Dose   acetaminophen (TYLENOL) 500 MG tablet Take 2 tablets (1,000 mg total) by mouth every 6 (six) hours as needed. (Patient not taking: Reported on 09/20/2020) 30 tablet 0    azithromycin (ZITHROMAX) 500 MG tablet TAKE 2 TABLETS (1,000 MG TOTAL) BY MOUTH ONCE FOR 1 DOSE. (Patient not taking: Reported on 03/17/2021) 2 tablet 1    diphenhydrAMINE (BENADRYL) 25 mg capsule Take 1 capsule (25 mg total) by mouth at bedtime as needed for sleep. (Patient not taking: Reported on 09/20/2020) 30 capsule 0    ibuprofen (ADVIL) 600 MG tablet Take 1 tablet (600 mg total) by mouth every 6 (six) hours as needed for mild pain. (Patient not taking: Reported on 09/20/2020) 10 tablet 0    Prenatal Vit-Iron Carbonyl-FA (KOSHER PRENATAL PLUS IRON)  30-1 MG TABS Take 1 tablet by mouth daily. (Patient not taking: Reported on 09/20/2020) 90 tablet 3     Review of Systems  Gastrointestinal:  Negative for abdominal pain.  Genitourinary:  Negative for difficulty urinating, dysuria and vaginal bleeding.  Physical Exam   Blood pressure 119/60, pulse 69, temperature 98.7 F (37.1 C), temperature source Oral, resp. rate 15, height 5\' 6"  (1.676 m), weight 63.5 kg, SpO2 100 %.  Physical Exam Vitals reviewed. Exam conducted with a chaperone present.  Constitutional:      General: She is not in acute distress.    Appearance: Normal appearance. She is not ill-appearing.  HENT:     Head: Normocephalic and atraumatic.  Eyes:     Conjunctiva/sclera: Conjunctivae normal.  Cardiovascular:     Rate and Rhythm: Normal rate.  Pulmonary:     Effort: Pulmonary effort is normal.  Genitourinary:    Comments: Speculum Exam: -Normal External Genitalia: Non tender, no apparent discharge at introitus.  -Vaginal Vault: Pink mucosa with good rugae. Small amt brownish white curdy discharge -Cervix:Pink, no lesions, cysts,  or polyps.  Appears closed. No active bleeding or leaking from os. -Bimanual Exam:  Deferred Skin:    General: Skin is warm and dry.  Neurological:     Mental Status: She is alert and oriented to person, place, and time.  Psychiatric:        Mood and Affect: Mood normal.        Behavior: Behavior normal.        Thought Content: Thought content normal.    MAU Course  Procedures Results for orders placed or performed during the hospital encounter of 04/02/21 (from the past 24 hour(s))  hCG, quantitative, pregnancy     Status: Abnormal   Collection Time: 04/02/21  7:47 AM  Result Value Ref Range   hCG, Beta Chain, Quant, S 3,880 (H) <5 mIU/mL  Wet prep, genital     Status: Abnormal   Collection Time: 04/02/21  9:24 AM   Specimen: PATH Cytology Cervicovaginal Ancillary Only  Result Value Ref Range   Yeast Wet Prep HPF POC NONE  SEEN NONE SEEN   Trich, Wet Prep NONE SEEN NONE SEEN   Clue Cells Wet Prep HPF POC NONE SEEN NONE SEEN   WBC, Wet Prep HPF POC FEW (A) NONE SEEN   Sperm NONE SEEN    US OB Limited  Result Date: 04/02/2021 CLINICAL DATA:  Miscarriage suspected. EXAM: LIMITED OBSTETRIC ULTRASOUND COMPARISON:  None. FINDINGS: Number of Fetuses: 1 Heart Rate:  153 bpm Movement: Yes Presentation: Breech Placental Location: Anterior/fundal Previa: No Amniotic Fluid (Subjective):  Markedly diminished.  Anhydramnios. FL: 2.9 cm 18 w  5 d MATERNAL FINDINGS: Cervix:  Appears open with a length of 2.75. Uterus/Adnexae: No abnormality visualized. Other: Signs of fetal ventriculomegaly. With lateral ventricular diameter measuring 11.1 mm. IMPRESSION: 1. Signs of anhydramnios with complete lack or near complete lack of amniotic fluid. 2. Mild/borderline fetal ventriculomegaly. 3. Fetal heart rate visualized measuring 153 beats per minute. This exam is performed on an emergent basis and does not comprehensively evaluate fetal size, dating, or anatomy; follow-up complete OB US should be considered if further fetal assessment is warranted. Critical Value/emergent results were called by telephone at the time of interpretation on 04/02/2021 at 10:38 am to provider [facility] , who verbally acknowledged these results. Electronically Signed   By: Signa Kell M.D.   On: 04/02/2021 10:38    MDM Pelvic Exam Labs: CBC/D Assessment and Plan  20 year old, G3P0110 SIUP at 18.5 weeks Anahydramnios Fetal Ventriculomegaly Breech Presentation  -Informed that pelvic exam will be completed to r/o cervical dilation.  -Exam performed and findings discussed. -Precautions given.  Patient strongly encouraged to report to MAU for any: *Vaginal Bleeding *Fever *Vaginal Odor *Informed that fetus is breech  *Informed that delivery could occur quickly and put patient at risk for hemorrhage. -Will also collect CBC with diff to r/o infection.   -Patient verbalizes understanding and without questions or concerens. -Plan to return to [facility] on Friday July 1st for IOL. -Encouraged to call or return to MAU if symptoms worsen or with the onset of new symptoms. -Discharged to home in stable condition.  Cherre Robins 04/02/2021, 4:26 PM

## 2021-04-02 NOTE — ED Provider Notes (Signed)
[facility] EMERGENCY DEPARTMENT Provider Note   CSN: [id number] Arrival date & time: 04/02/21  0700     History Chief Complaint  Patient presents with   Vaginal Discharge    Loretta Vazquez is a 20 y.o. female presenting to emergency department with clear vaginal discharge.  She reports some bloody clots and clear fluid and discharge for the past several days.  She was diagnosed with a pregnancy on 03/17/21 with hCG level of 8185 at that time, corresponding with approx 6 weeks pregnancy.  LMP was in early April.  She has not had any prenatal care and has not seen an OB/GYN for this.  This is not her first pregnancy.  She denies any other medical problems.  She reports that she was diagnosed with BV and treated with Flagyl gel at that time after her UC visit.  HPI     History reviewed. No pertinent past medical history.  Patient Active Problem List   Diagnosis Date Noted   IUFD at 20 weeks or more of gestation 09/06/2020   Iron deficiency anemia secondary to inadequate dietary iron intake 08/13/2020   Supervision of other normal pregnancy, antepartum 04/19/2020    Past Surgical History:  Procedure Laterality Date   DENTAL SURGERY       OB History     Gravida  3   Para  1   Term      Preterm  1   AB  1   Living         SAB  1   IAB      Ectopic      Multiple  0   Live Births              History reviewed. No pertinent family history.  Social History   Tobacco Use   Smoking status: Former    Packs/day: 0.00    Pack years: 0.00    Types: Cigars, Cigarettes   Smokeless tobacco: Never  Vaping Use   Vaping Use: Never used  Substance Use Topics   Alcohol use: No   Drug use: Not Currently    Types: Marijuana    Comment: Last smoked June 2021    Home Medications Prior to Admission medications   Medication Sig Start Date End Date Taking? Authorizing Provider  acetaminophen (TYLENOL) 500 MG tablet Take 2 tablets (1,000 mg total)  by mouth every 6 (six) hours as needed. Patient not taking: Reported on 09/20/2020 06/23/20   Arby Barrette, MD  azithromycin (ZITHROMAX) 500 MG tablet TAKE 2 TABLETS (1,000 MG TOTAL) BY MOUTH ONCE FOR 1 DOSE. Patient not taking: Reported on 03/17/2021 08/10/20 08/10/21  Levie Heritage, DO  diphenhydrAMINE (BENADRYL) 25 mg capsule Take 1 capsule (25 mg total) by mouth at bedtime as needed for sleep. Patient not taking: Reported on 09/20/2020 09/08/20   Chili Bing, MD  ibuprofen (ADVIL) 600 MG tablet Take 1 tablet (600 mg total) by mouth every 6 (six) hours as needed for mild pain. Patient not taking: Reported on 09/20/2020 06/28/20   Rasch, Victorino Dike I, NP  Prenatal Vit-Iron Carbonyl-FA (KOSHER PRENATAL PLUS IRON) 30-1 MG TABS Take 1 tablet by mouth daily. Patient not taking: Reported on 09/20/2020 08/10/20   Levie Heritage, DO    Allergies    Patient has no known allergies.  Review of Systems   Review of Systems  Constitutional:  Negative for chills and fever.  Respiratory:  Negative for cough and shortness of breath.  Cardiovascular:  Negative for chest pain and palpitations.  Gastrointestinal:  Negative for abdominal pain and vomiting.  Genitourinary:  Positive for vaginal discharge. Negative for dysuria, hematuria and vaginal bleeding.  Musculoskeletal:  Negative for arthralgias and back pain.  Skin:  Negative for color change and rash.  Neurological:  Negative for syncope and light-headedness.  All other systems reviewed and are negative.  Physical Exam Updated Vital Signs BP 120/80 (BP Location: Right Arm)   Pulse 65   Temp 98.3 F (36.8 C) (Oral)   Resp 18   Ht 5\' 6"  (1.676 m)   Wt 63.5 kg   LMP  (LMP Unknown)   SpO2 100%   BMI 22.60 kg/m   Physical Exam Constitutional:      General: She is not in acute distress. HENT:     Head: Normocephalic and atraumatic.  Eyes:     Conjunctiva/sclera: Conjunctivae normal.     Pupils: Pupils are equal, round, and  reactive to light.  Cardiovascular:     Rate and Rhythm: Normal rate and regular rhythm.  Pulmonary:     Effort: Pulmonary effort is normal. No respiratory distress.  Abdominal:     General: There is no distension.     Tenderness: There is no abdominal tenderness.  Genitourinary:    Comments: GU exam documented in ED course below Skin:    General: Skin is warm and dry.  Neurological:     General: No focal deficit present.     Mental Status: She is alert and oriented to person, place, and time. Mental status is at baseline.  Psychiatric:        Mood and Affect: Mood normal.        Behavior: Behavior normal.    ED Results / Procedures / Treatments   Labs (all labs ordered are listed, but only abnormal results are displayed) Labs Reviewed  WET PREP, GENITAL - Abnormal; Notable for the following components:      Result Value   WBC, Wet Prep HPF POC FEW (*)    All other components within normal limits  HCG, QUANTITATIVE, PREGNANCY - Abnormal; Notable for the following components:   hCG, Beta Chain, Quant, S 3,880 (*)    All other components within normal limits  GC/CHLAMYDIA PROBE AMP ([place]) NOT AT [facility]    EKG None  Radiology [facility] OB Limited  Result Date: 04/02/2021 CLINICAL DATA:  Miscarriage suspected. EXAM: LIMITED OBSTETRIC ULTRASOUND COMPARISON:  None. FINDINGS: Number of Fetuses: 1 Heart Rate:  153 bpm Movement: Yes Presentation: Breech Placental Location: Anterior/fundal Previa: No Amniotic Fluid (Subjective):  Markedly diminished.  Anhydramnios. FL: 2.9 cm 18 w  5 d MATERNAL FINDINGS: Cervix:  Appears open with a length of 2.75. Uterus/Adnexae: No abnormality visualized. Other: Signs of fetal ventriculomegaly. With lateral ventricular diameter measuring 11.1 mm. IMPRESSION: 1. Signs of anhydramnios with complete lack or near complete lack of amniotic fluid. 2. Mild/borderline fetal ventriculomegaly. 3. Fetal heart rate visualized measuring 153 beats per minute. This  exam is performed on an emergent basis and does not comprehensively evaluate fetal size, dating, or anatomy; follow-up complete OB 04/04/2021 should be considered if further fetal assessment is warranted. Critical Value/emergent results were called by telephone at the time of interpretation on 04/02/2021 at 10:38 am to provider [facility] , who verbally acknowledged these results. Electronically Signed   By: [facility] M.D.   On: 04/02/2021 10:38    Procedures Procedures   Medications Ordered in ED Medications - No  data to display  ED Course  I have reviewed the triage vital signs and the nursing notes.  Pertinent labs & imaging results that were available during my care of the patient were reviewed by me and considered in my medical decision making (see chart for details).  Labs reviewed showing diminishing hCG levels.  Ultrasound was reviewed showing fetal heart rate is now below, consistent with water break.  I now suspect clinically this is consistent with her GU exam, that her gestational sac has ruptured, and this is not an infection.  Spoke to the OB/GYN provider as noted below, Dr. Adrian Blackwater, who advised transferring the patient to the MAU for counseling, as noted below.   Clinical Course as of 04/02/21 1427  Tue Apr 02, 2021  0924 Pelvic exam performed with chaperone Wyvonnia Lora RN present.  On speculum exam she does have thick greenish discharge in the vaginal vault.  This could be consistent with BV.  There is no pruritus or itching, less likely candidiasis.  We will send a wet prep.  Her cervix appears partially open.  We will also obtain a pelvic ultrasound at this time to evaluate for retained products of conception.  She has no tenderness on cervical exam, no significant erythema, and clinically I have a very low suspicion for PID or septic abortion. [MT]  1046 IMPRESSION: 1. Signs of anhydramnios with complete lack or near complete lack of amniotic fluid. 2. Mild/borderline  fetal ventriculomegaly. 3. Fetal heart rate visualized measuring 153 beats per minute. [MT]  1140 I spoke to Dr Adrian Blackwater from [facility] who recommends patient transfer to MAU for counselling.  I explained this plan to the patient who agrees and will go by private vehicle directly to the MAU.  I do believe she stable to go by private vehicle.  Explained her that it is likely that her water broke, and although there is a fetal heartbeat, this is very likely going to be a miscarriage.  She does need OB counseling at this time.  She will go to the MAU. [MT]    Clinical Course User Index [MT] Noah Lembke, Kermit Balo, MD     Final Clinical Impression(s) / ED Diagnoses Final diagnoses:  [redacted] weeks gestation of pregnancy    Rx / DC Orders ED Discharge Orders     None        Terald Sleeper, MD 04/02/21 1427

## 2021-04-03 LAB — GC/CHLAMYDIA PROBE AMP (~~LOC~~) NOT AT ARMC
Chlamydia: NEGATIVE
Comment: NEGATIVE
Comment: NORMAL
Neisseria Gonorrhea: NEGATIVE

## 2021-04-05 ENCOUNTER — Other Ambulatory Visit: Payer: Self-pay

## 2021-04-05 ENCOUNTER — Encounter (HOSPITAL_COMMUNITY): Payer: Self-pay | Admitting: Family Medicine

## 2021-04-05 ENCOUNTER — Observation Stay (HOSPITAL_COMMUNITY)
Admission: AD | Admit: 2021-04-05 | Discharge: 2021-04-06 | DRG: 807 | Disposition: A | Discharge status: Home / Self Care | Payer: Medicaid Other | Attending: Family Medicine | Admitting: Family Medicine

## 2021-04-05 DIAGNOSIS — Z30017 Encounter for initial prescription of implantable subdermal contraceptive: Secondary | ICD-10-CM

## 2021-04-05 DIAGNOSIS — Z87891 Personal history of nicotine dependence: Secondary | ICD-10-CM

## 2021-04-05 DIAGNOSIS — O42912 Preterm premature rupture of membranes, unspecified as to length of time between rupture and onset of labor, second trimester: Secondary | ICD-10-CM | POA: Diagnosis not present

## 2021-04-05 DIAGNOSIS — Z3A19 19 weeks gestation of pregnancy: Secondary | ICD-10-CM | POA: Diagnosis not present

## 2021-04-05 DIAGNOSIS — Z20822 Contact with and (suspected) exposure to covid-19: Secondary | ICD-10-CM | POA: Diagnosis present

## 2021-04-05 DIAGNOSIS — O42012 Preterm premature rupture of membranes, onset of labor within 24 hours of rupture, second trimester: Secondary | ICD-10-CM | POA: Diagnosis not present

## 2021-04-05 DIAGNOSIS — O42919 Preterm premature rupture of membranes, unspecified as to length of time between rupture and onset of labor, unspecified trimester: Secondary | ICD-10-CM | POA: Diagnosis present

## 2021-04-05 LAB — TYPE AND SCREEN
ABO/RH(D): O POS
Antibody Screen: NEGATIVE

## 2021-04-05 LAB — CBC
HCT: 32 % — ABNORMAL LOW (ref 36.0–46.0)
Hemoglobin: 10.9 g/dL — ABNORMAL LOW (ref 12.0–15.0)
MCH: 33 pg (ref 26.0–34.0)
MCHC: 34.1 g/dL (ref 30.0–36.0)
MCV: 97 fL (ref 80.0–100.0)
Platelets: 192 10*3/uL (ref 150–400)
RBC: 3.3 MIL/uL — ABNORMAL LOW (ref 3.87–5.11)
RDW: 12.7 % (ref 11.5–15.5)
WBC: 9.1 10*3/uL (ref 4.0–10.5)
nRBC: 0.2 % (ref 0.0–0.2)

## 2021-04-05 MED ORDER — LACTATED RINGERS IV SOLN: INTRAVENOUS | Status: DC

## 2021-04-05 MED ORDER — ZOLPIDEM TARTRATE 5 MG PO TABS: 5.0000 mg | ORAL_TABLET | Freq: Every evening | ORAL | Status: DC | PRN

## 2021-04-05 MED ORDER — MISOPROSTOL 200 MCG PO TABS
400.0000 ug | ORAL_TABLET | ORAL | Status: DC
  Administered 2021-04-05 – 2021-04-06 (×2): 400 ug via VAGINAL
  Filled 2021-04-05 (×3): qty 2

## 2021-04-05 MED ORDER — CALCIUM CARBONATE ANTACID 500 MG PO CHEW: 2.0000 | CHEWABLE_TABLET | ORAL | Status: DC | PRN

## 2021-04-05 MED ORDER — ACETAMINOPHEN 325 MG PO TABS: 650.0000 mg | ORAL_TABLET | ORAL | Status: DC | PRN

## 2021-04-05 MED ORDER — DOCUSATE SODIUM 100 MG PO CAPS
100.0000 mg | ORAL_CAPSULE | Freq: Every day | ORAL | Status: DC
  Administered 2021-04-06: 100 mg via ORAL
  Filled 2021-04-05: qty 1

## 2021-04-05 NOTE — H&P (Signed)
Loretta Vazquez is an 20 y.o. [phone] [redacted]w[redacted]d female.   Chief Complaint: Previable PPROM HPI: Patient with h/o 2nd trimester loss now with probable PPROM. Please see notes dated 04/02/2021 for counseling around outcomes. Patient has elected for induction given poor prognosis.  History reviewed. No pertinent past medical history.  Past Surgical History:  Procedure Laterality Date   DENTAL SURGERY      History reviewed. No pertinent family history. Social History:  reports that she has quit smoking. Her smoking use included cigars. She has never used smokeless tobacco. She reports previous drug use. Drug: Marijuana. She reports that she does not drink alcohol.   No Known Allergies  No medications prior to admission.     A comprehensive review of systems was negative.  Blood pressure (!) 106/40, pulse 84, temperature 97.7 F (36.5 C), temperature source Oral, resp. rate 18, height 5\' 6"  (1.676 m), weight 63.5 kg, SpO2 100 %. General appearance: alert, cooperative, and appears stated age Head: Normocephalic, without obvious abnormality, atraumatic Neck: supple, symmetrical, trachea midline Lungs:  normal effort Heart: regular rate and rhythm Abdomen: soft, non-tender; bowel sounds normal; no masses,  no organomegaly Extremities: extremities normal, atraumatic, no cyanosis or edema Skin: Skin color, texture, turgor normal. No rashes or lesions Neurologic: Grossly normal   Lab Results  Component Value Date   WBC 9.1 04/05/2021   HGB 10.9 (L) 04/05/2021   HCT 32.0 (L) 04/05/2021   MCV 97.0 04/05/2021   PLT 192 04/05/2021           ABO, Rh: --/--/O POS (07/01 1825)  Antibody: NEG (07/01 1825)  Rubella: 6.20 (07/15 1448)  RPR: NON REACTIVE (12/02 1109)  HBsAg: Negative (07/15 1448)  HIV: Non Reactive (11/05 0903)      Assessment/Plan Principal Problem:   Preterm premature rupture of membranes (PPROM) delivered, current hospitalization  Patient is scheduled for  delivery. Will begin Cytotec 400 mcg pv q 4 hours Pain meds prn  06-29-1998 04/05/2021, 10:29 PM

## 2021-04-06 ENCOUNTER — Encounter (HOSPITAL_COMMUNITY): Payer: Self-pay

## 2021-04-06 DIAGNOSIS — O42919 Preterm premature rupture of membranes, unspecified as to length of time between rupture and onset of labor, unspecified trimester: Secondary | ICD-10-CM

## 2021-04-06 DIAGNOSIS — Z30017 Encounter for initial prescription of implantable subdermal contraceptive: Secondary | ICD-10-CM

## 2021-04-06 DIAGNOSIS — Z3A19 19 weeks gestation of pregnancy: Secondary | ICD-10-CM | POA: Diagnosis not present

## 2021-04-06 DIAGNOSIS — Z87891 Personal history of nicotine dependence: Secondary | ICD-10-CM | POA: Diagnosis not present

## 2021-04-06 DIAGNOSIS — O021 Missed abortion: Secondary | ICD-10-CM | POA: Diagnosis not present

## 2021-04-06 DIAGNOSIS — O42912 Preterm premature rupture of membranes, unspecified as to length of time between rupture and onset of labor, second trimester: Secondary | ICD-10-CM | POA: Diagnosis not present

## 2021-04-06 DIAGNOSIS — O42012 Preterm premature rupture of membranes, onset of labor within 24 hours of rupture, second trimester: Secondary | ICD-10-CM | POA: Diagnosis not present

## 2021-04-06 DIAGNOSIS — Z20822 Contact with and (suspected) exposure to covid-19: Secondary | ICD-10-CM | POA: Diagnosis present

## 2021-04-06 LAB — SARS CORONAVIRUS 2 (TAT 6-24 HRS): SARS Coronavirus 2: NEGATIVE

## 2021-04-06 MED ORDER — HYDROMORPHONE HCL 1 MG/ML IJ SOLN
1.0000 mg | INTRAMUSCULAR | Status: DC | PRN
  Administered 2021-04-06: 1 mg via INTRAVENOUS
  Filled 2021-04-06: qty 1

## 2021-04-06 MED ORDER — LIDOCAINE HCL 1 % IJ SOLN
0.0000 mL | Freq: Once | INTRAMUSCULAR | Status: AC | PRN
  Administered 2021-04-06: 20 mL via INTRADERMAL
  Filled 2021-04-06: qty 20

## 2021-04-06 MED ORDER — ETONOGESTREL 68 MG ~~LOC~~ IMPL
68.0000 mg | DRUG_IMPLANT | Freq: Once | SUBCUTANEOUS | Status: AC
  Administered 2021-04-06: 68 mg via SUBCUTANEOUS
  Filled 2021-04-06: qty 1

## 2021-04-06 NOTE — Progress Notes (Signed)
Pt up to void. Delivered a nonviable female without any signs of life. Placenta delivered at 0530. Minimal amount of vaginal bleeding noted. Pt c/o mild cramping at this time. Will notify Dr Shawnie Pons.

## 2021-04-06 NOTE — Procedures (Signed)
PROCEDURE NOTE  Loretta Vazquez is a 20 y.o. G8T1572 s/p vaginal delivery for 19 week fetal demise desiring here for  Nexplanon insertion.    Nexplanon Insertion Procedure Patient identified, informed consent performed, consent signed.   Patient does understand that irregular bleeding is a very common side effect of this medication.  Appropriate time out taken.  Patient's left arm was prepped and draped in the usual sterile fashion. The ruler used to measure and mark insertion area.  Patient was prepped with alcohol swab and then injected with 3 ml of 1% lidocaine.  She was prepped with betadine, Nexplanon removed from packaging,  Device confirmed in needle, then inserted full length of needle and withdrawn per handbook instructions. Nexplanon was able to palpated in the patient's arm; patient palpated the insert herself. There was minimal blood loss.  Patient insertion site covered with guaze and a pressure bandage to reduce any bruising.  The patient tolerated the procedure well and was given post procedure instructions.    Jaynie Collins, MD, FACOG Obstetrician & Gynecologist, [facility] for [employer], [facility]

## 2021-04-06 NOTE — Progress Notes (Signed)
Post mortem care provided to fetus. Fetus was 9 inches long and weighed 8.5oz.

## 2021-04-06 NOTE — Discharge Summary (Signed)
Postpartum Discharge Summary      Patient Name: Loretta Vazquez DOB: Mar 10, 2001 MRN: [id number]  Date of admission: 04/05/2021 Delivery date: 04/06/21 Delivering provider: Marquis Buggy, RN Date of discharge: 04/06/2021  Admitting diagnosis: Preterm premature rupture of membranes (PPROM) delivered, current hospitalization [O42.919] Intrauterine pregnancy: [redacted]w[redacted]d     Secondary diagnosis:  Principal Problem:   Preterm premature rupture of membranes (PPROM) delivered, current hospitalization  Additional problems: none    Discharge diagnosis: Preterm Pregnancy Delivered                                              Post partum procedures: Nexplanon Augmentation: Cytotec Complications: None  Hospital course: Induction of Labor With Vaginal Delivery   20 y.o. yo G3P0110 at [redacted]w[redacted]d was admitted to the hospital 04/05/2021 for induction of labor.  Indication for induction:  previable PPROM .  Patient had an uncomplicated labor course as follows: Given several rounds of 400 mcg of cytotec, became uncomfortable. Delivered. Baby with no signs of life. Placenta delivered with the baby. Details of delivery can be found in separate delivery note.  Patient had a routine postpartum course. Patient is discharged home 04/06/21.  Transfusion:No  Physical exam  Vitals:   04/06/21 0130 04/06/21 0517 04/06/21 0542 04/06/21 0749  BP: (!) 101/52 (!) 115/58 111/60 (!) 109/50  Pulse: 63 72 68 60  Resp: 17 18 17 18   Temp: 98.4 F (36.9 C) 98.1 F (36.7 C) 99.1 F (37.3 C) 98.5 F (36.9 C)  TempSrc: Oral Oral Oral Oral  SpO2: 100% 100% 100% 100%  Weight:      Height:       General: alert Lochia: appropriate Uterine Fundus: firm DVT Evaluation: No evidence of DVT seen on physical exam. Labs: Lab Results  Component Value Date   WBC 9.1 04/05/2021   HGB 10.9 (L) 04/05/2021   HCT 32.0 (L) 04/05/2021   MCV 97.0 04/05/2021   PLT 192 04/05/2021   No flowsheet data found. Edinburgh Score: Edinburgh  Postnatal Depression Scale Screening Tool 09/20/2020  I have been able to laugh and see the funny side of things. 0  I have looked forward with enjoyment to things. 0  I have blamed myself unnecessarily when things went wrong. 0  I have been anxious or worried for no good reason. 0  I have felt scared or panicky for no good reason. 1  Things have been getting on top of me. 2  I have been so unhappy that I have had difficulty sleeping. 2  I have felt sad or miserable. 1  I have been so unhappy that I have been crying. 0  The thought of harming myself has occurred to me. 0  Edinburgh Postnatal Depression Scale Total 6     After visit meds:  Allergies as of 04/06/2021   No Known Allergies      Medication List    You have not been prescribed any medications.      Discharge home in stable condition  Discharge instruction: per After Visit Summary and Postpartum booklet. Activity: Advance as tolerated. Pelvic rest for 6 weeks.  Diet: routine diet Future Appointments:No future appointments. Follow up Visit:  Follow-up Information     [facility] Follow up.   Specialty: Obstetrics and Gynecology Why: they will call you with  an appointment, pp check Contact information: [address] [place] [address] [phone]                 Anticipated Birth Control:  PP Nexplanon placed   04/06/2021 Reva Bores, MD

## 2021-04-06 NOTE — Progress Notes (Signed)
Pt given discharge instructions and all questions answered. IV discontinued.  

## 2021-04-09 ENCOUNTER — Telehealth: Payer: Self-pay

## 2021-04-09 NOTE — Telephone Encounter (Signed)
Transition Care Management Follow-up Telephone Call Date of discharge and from where: 04/06/2021 from [facility] How have you been since you were released from the hospital? Pt stated that she is feeling okay today. She has no questions or concerns at this time.  Any questions or concerns? No  Items Reviewed: Did the pt receive and understand the discharge instructions provided? Yes  Medications obtained and verified? No  Other? No  Any new allergies since your discharge? No  Dietary orders reviewed? No Do you have support at home? Yes   Functional Questionnaire: (I = Independent and D = Dependent) ADLs: I  Bathing/Dressing- I  Meal Prep- I  Eating- I  Maintaining continence- I  Transferring/Ambulation- I  Managing Meds- I   Follow up appointments reviewed:  PCP Hospital f/u appt confirmed? No   Specialist Hospital f/u appt confirmed? No   Are transportation arrangements needed? No  If their condition worsens, is the pt aware to call PCP or go to the Emergency Dept.? Yes Was the patient provided with contact information for the PCP's office or ED? Yes Was to pt encouraged to call back with questions or concerns? Yes

## 2021-04-10 LAB — SURGICAL PATHOLOGY

## 2021-07-16 IMAGING — US US OB COMP LESS 14 WK
1 series · 14 of 28 positions shown · non-contrast
Comparison: None.

CLINICAL DATA: Low abdominal pain with positive pregnancy test.

EXAM:
OBSTETRIC <14 WK US AND TRANSVAGINAL OB US
TECHNIQUE: Both transabdominal and transvaginal ultrasound examinations were
performed for complete evaluation of the gestation as well as the
maternal uterus, adnexal regions, and pelvic cul-de-sac.
Transvaginal technique was performed to assess early pregnancy.

[Series 1: us ob comp less 14 wk · 34 acquisitions, 14 frames shown]
[im 2/34]
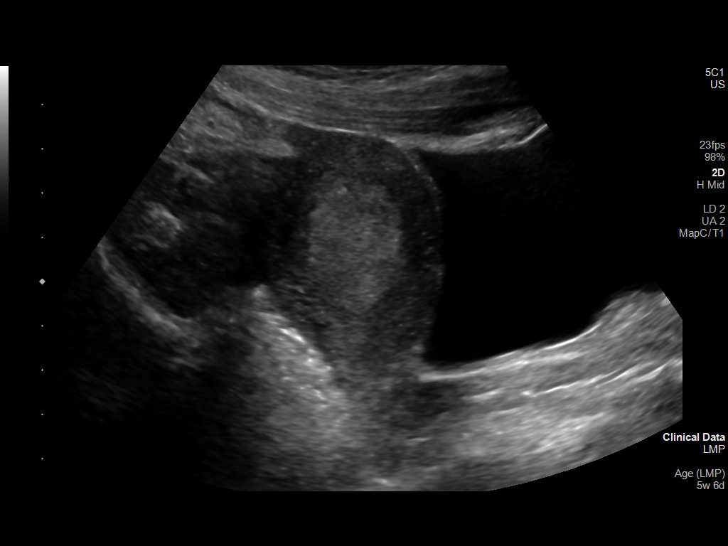
[im 4/34]
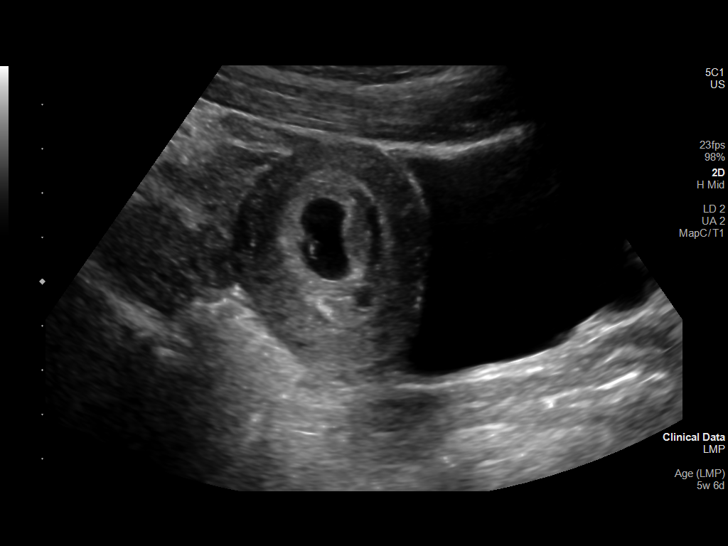
[im 7/34]
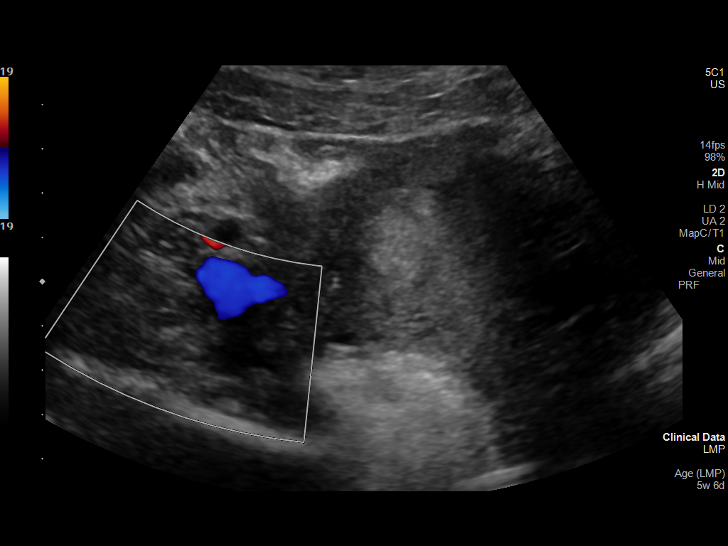
[im 9/34]
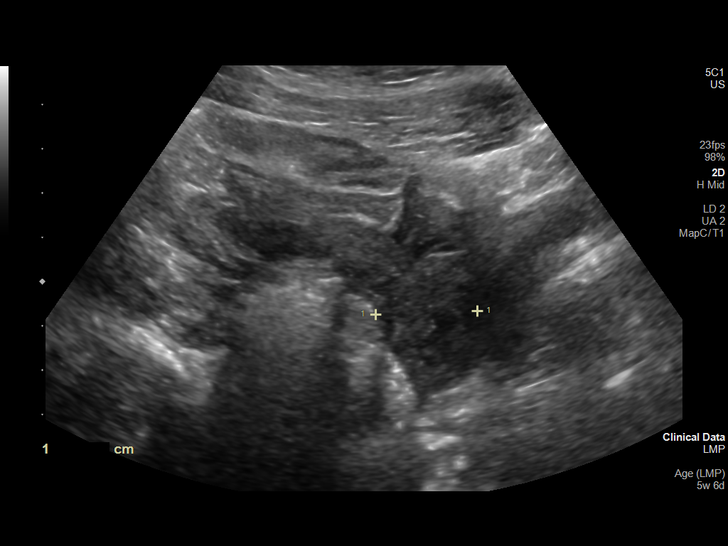
[im 12/34]
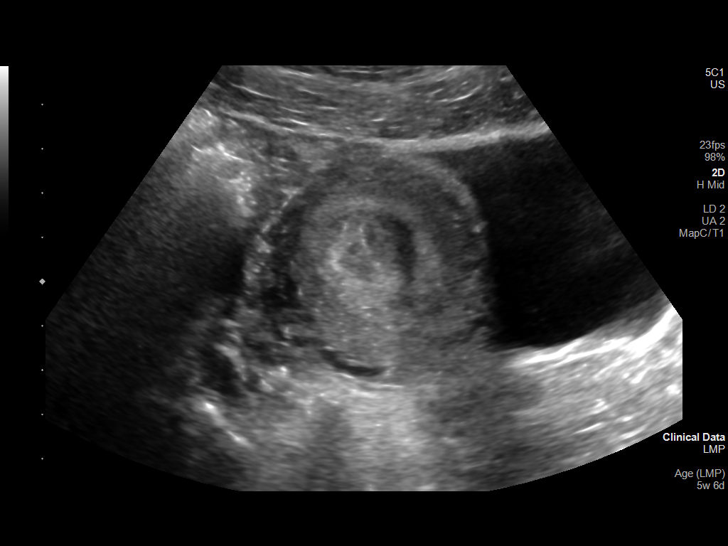
[im 14/34]
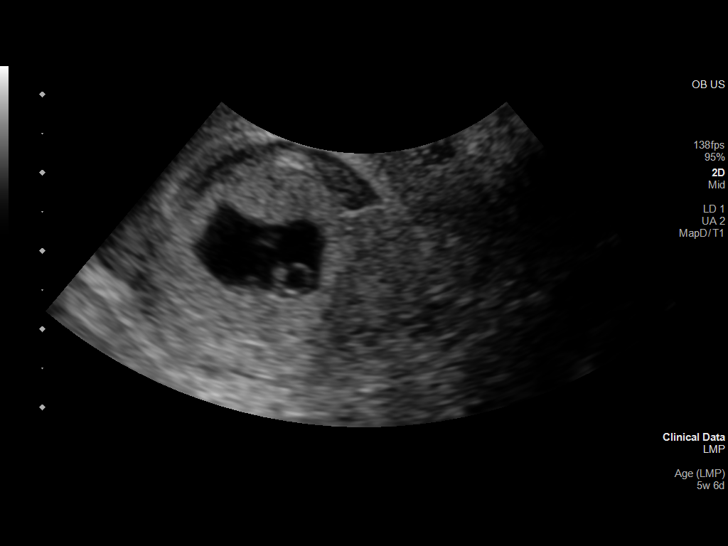
[im 16/34]
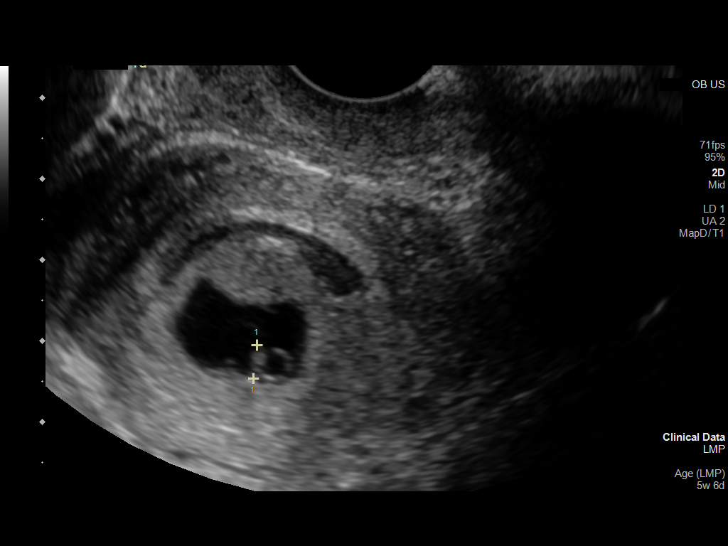
[im 19/34]
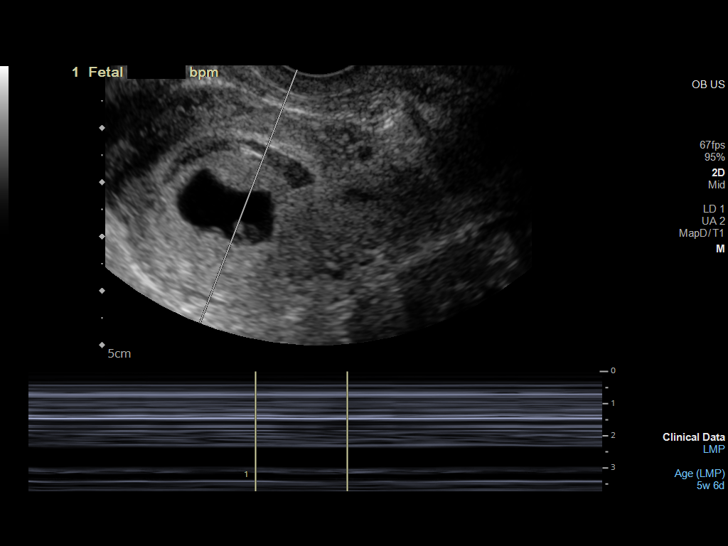
[im 21/34]
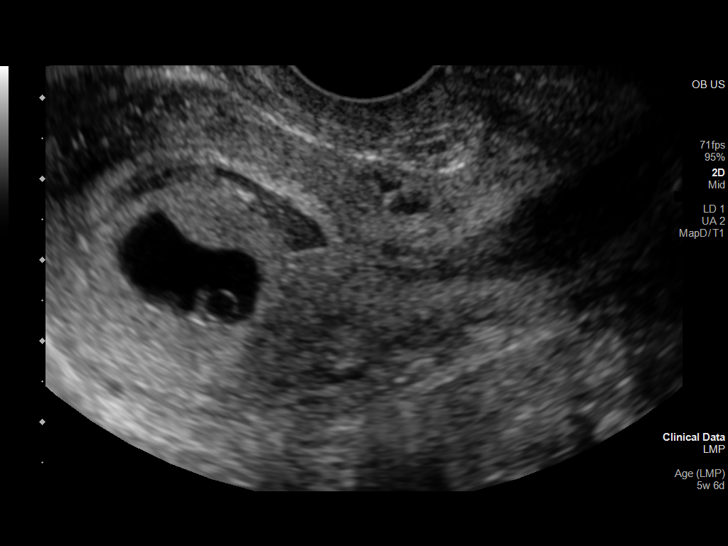
[im 24/34]
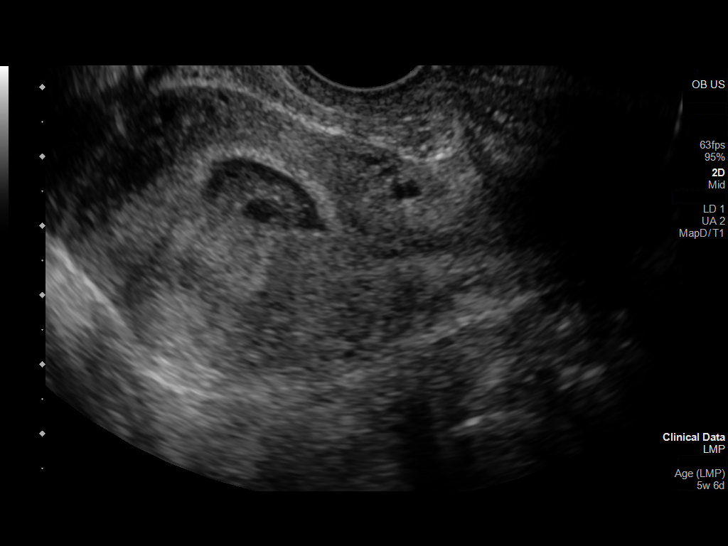
[im 26/34]
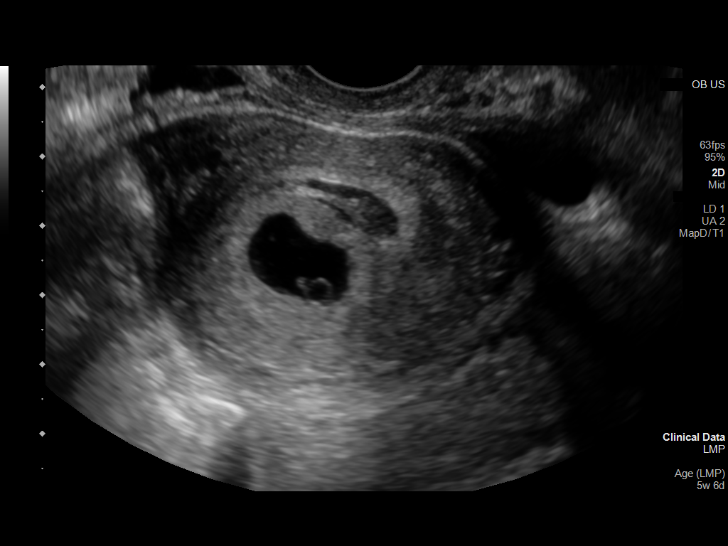
[im 29/34]
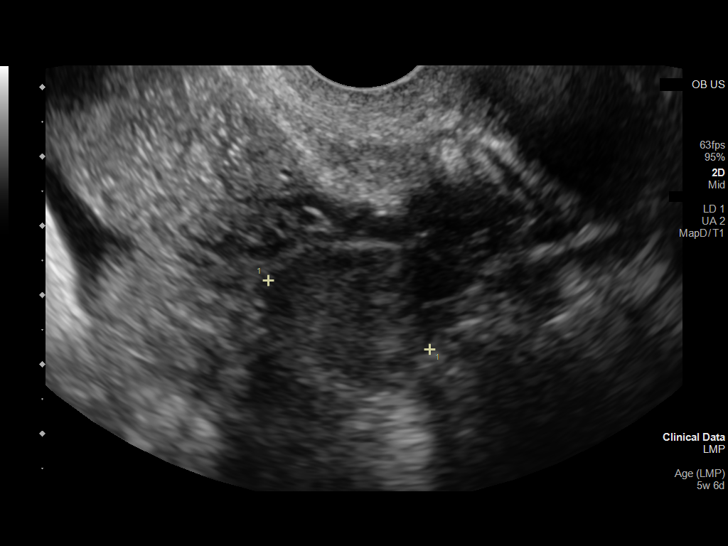
[im 31/34]
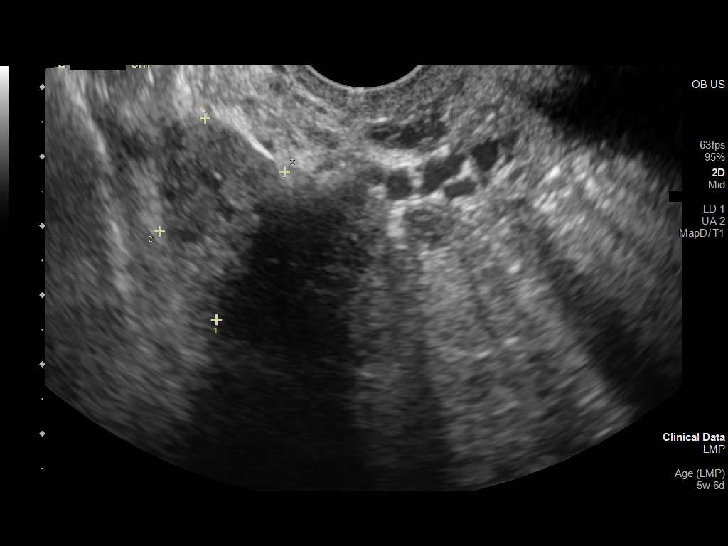
[im 34/34]
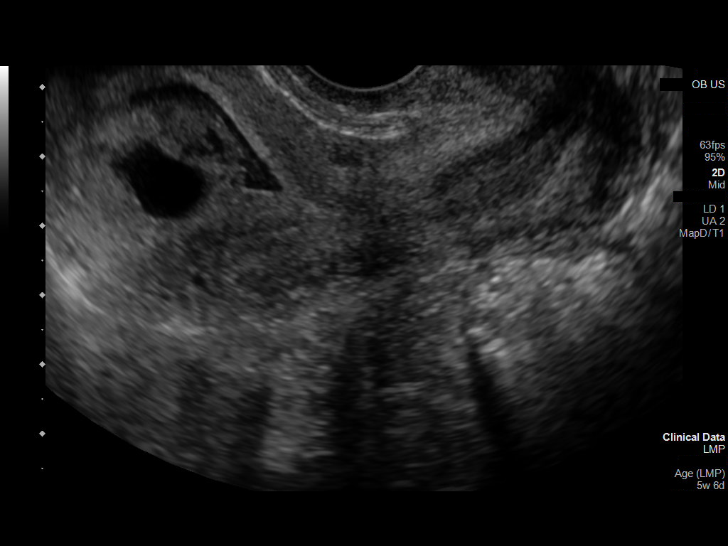

[14 of 28 positions shown; findings below may reference images not displayed]

FINDINGS: Intrauterine gestational sac: Single

Yolk sac:  Visualized.

Embryo:  Visualized.

Cardiac Activity: Visualized.

Heart Rate: 107 bpm

CRL: 4.4 mm   6 w   1 d                  US EDC: 06/17/2020

Subchorionic hemorrhage:  Moderate

Maternal uterus/adnexae: Unremarkable.
IMPRESSION: 1. Single living intrauterine gestation at estimated 6 week 1 day
gestational age by crown-rump length.
2. Moderate subchorionic hemorrhage.

## 2021-10-26 ENCOUNTER — Emergency Department (HOSPITAL_BASED_OUTPATIENT_CLINIC_OR_DEPARTMENT_OTHER): Payer: No Typology Code available for payment source

## 2021-10-26 ENCOUNTER — Emergency Department (HOSPITAL_BASED_OUTPATIENT_CLINIC_OR_DEPARTMENT_OTHER)
Admission: EM | Admit: 2021-10-26 | Discharge: 2021-10-26 | Disposition: A | Discharge status: Home / Self Care | Payer: No Typology Code available for payment source | Attending: Emergency Medicine | Admitting: Emergency Medicine

## 2021-10-26 ENCOUNTER — Encounter (HOSPITAL_BASED_OUTPATIENT_CLINIC_OR_DEPARTMENT_OTHER): Payer: Self-pay

## 2021-10-26 ENCOUNTER — Other Ambulatory Visit: Payer: Self-pay

## 2021-10-26 DIAGNOSIS — M79605 Pain in left leg: Secondary | ICD-10-CM | POA: Diagnosis not present

## 2021-10-26 DIAGNOSIS — M79604 Pain in right leg: Secondary | ICD-10-CM | POA: Insufficient documentation

## 2021-10-26 DIAGNOSIS — M79662 Pain in left lower leg: Secondary | ICD-10-CM | POA: Diagnosis not present

## 2021-10-26 DIAGNOSIS — M25561 Pain in right knee: Secondary | ICD-10-CM | POA: Diagnosis not present

## 2021-10-26 DIAGNOSIS — M25551 Pain in right hip: Secondary | ICD-10-CM | POA: Diagnosis not present

## 2021-10-26 DIAGNOSIS — M25562 Pain in left knee: Secondary | ICD-10-CM | POA: Diagnosis not present

## 2021-10-26 DIAGNOSIS — M25559 Pain in unspecified hip: Secondary | ICD-10-CM

## 2021-10-26 DIAGNOSIS — M79661 Pain in right lower leg: Secondary | ICD-10-CM | POA: Diagnosis not present

## 2021-10-26 LAB — PREGNANCY, URINE: Preg Test, Ur: NEGATIVE

## 2021-10-26 MED ORDER — IBUPROFEN 400 MG PO TABS
600.0000 mg | ORAL_TABLET | Freq: Once | ORAL | Status: AC
  Administered 2021-10-26: 600 mg via ORAL
  Filled 2021-10-26: qty 1
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
## 2021-10-28 ENCOUNTER — Telehealth: Payer: Self-pay

## 2021-10-28 NOTE — Telephone Encounter (Signed)
Transition Care Management Unsuccessful Follow-up Telephone Call  Date of discharge and from where:  10/26/2021[facility]   Attempts:  1st Attempt  Reason for unsuccessful TCM follow-up call:  Left voice message

## 2021-10-29 NOTE — Telephone Encounter (Signed)
Transition Care Management Unsuccessful Follow-up Telephone Call  Date of discharge and from where:  10/26/2021 from [facility]  Attempts:  2nd Attempt  Reason for unsuccessful TCM follow-up call:  Left voice message

## 2021-10-30 NOTE — Telephone Encounter (Signed)
Transition Care Management Unsuccessful Follow-up Telephone Call  Date of discharge and from where:  10/26/2021-[facility]   Attempts:  3rd Attempt  Reason for unsuccessful TCM follow-up call:  Left voice message

## 2021-12-11 ENCOUNTER — Ambulatory Visit: Payer: Medicaid Other | Admitting: Medical

## 2022-04-10 IMAGING — US US MFM OB FOLLOW-UP
1 series · 13 of 28 positions shown · non-contrast
Comparison: none

[Series 1: us mfm ob follow-up · 13 of 84 slices shown]
[im 4/84]
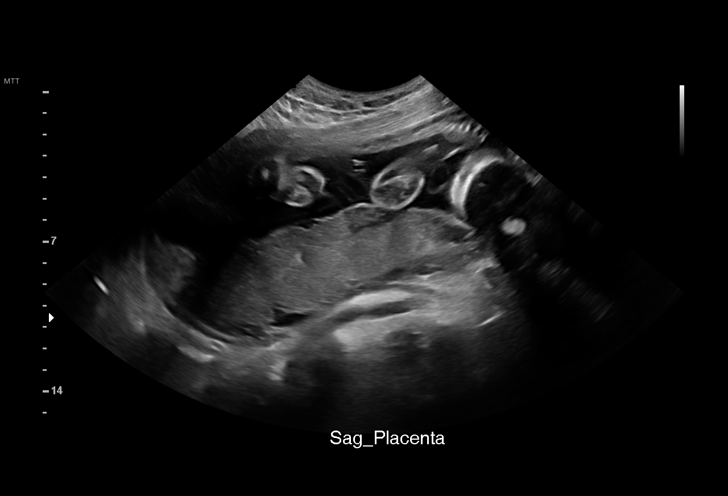
[im 10/84]
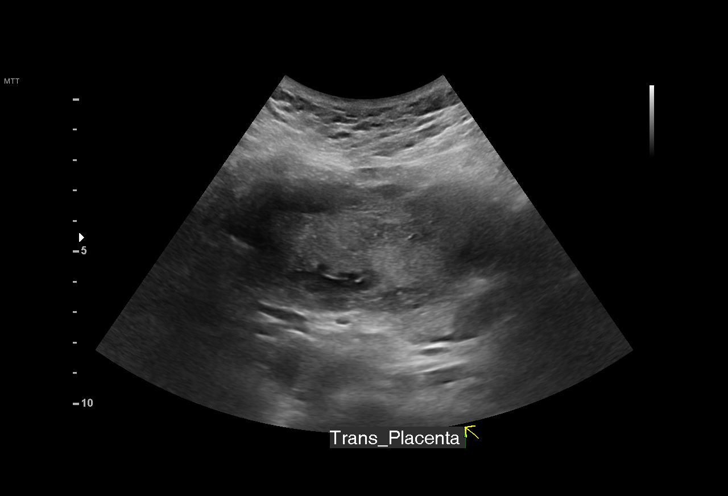
[im 16/84]
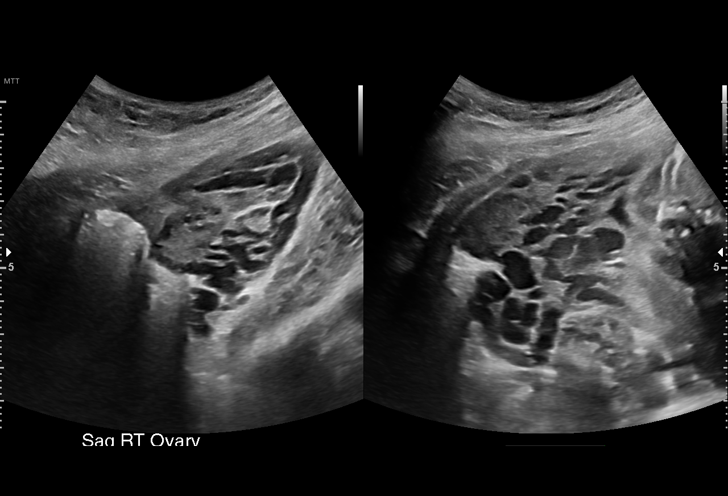
[im 22/84]
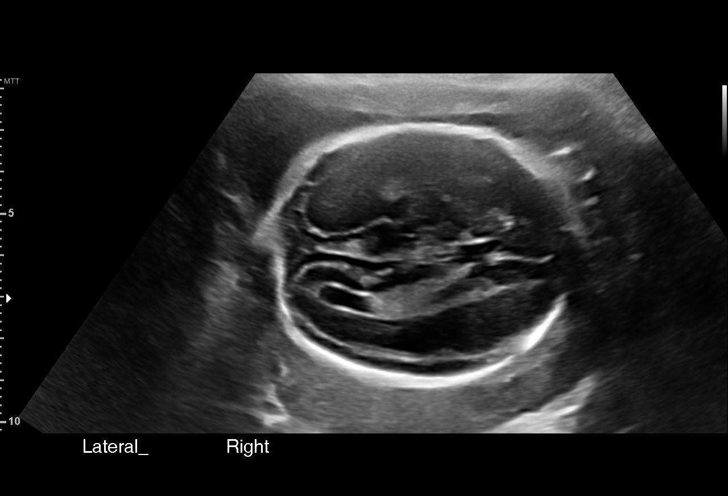
[im 28/84]
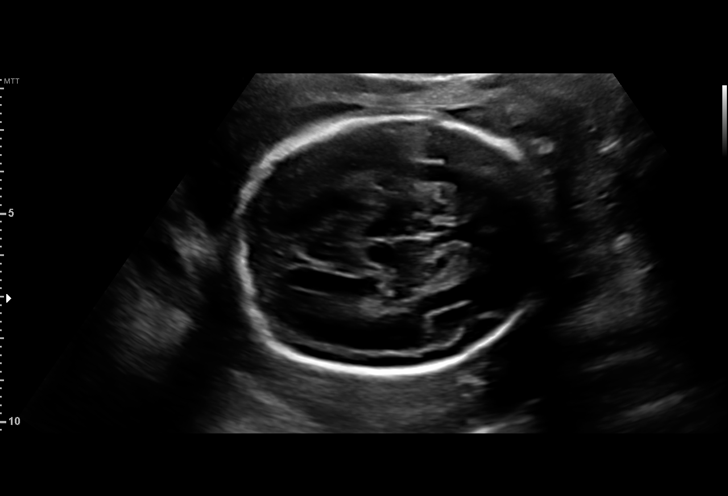
[im 34/84]
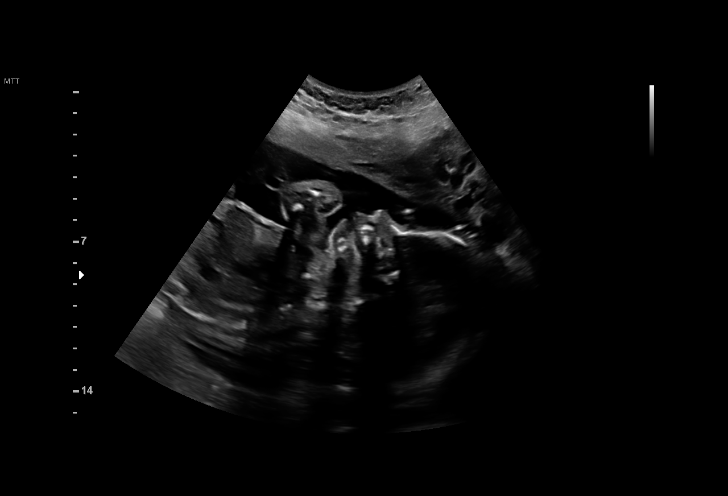
[im 44/84]
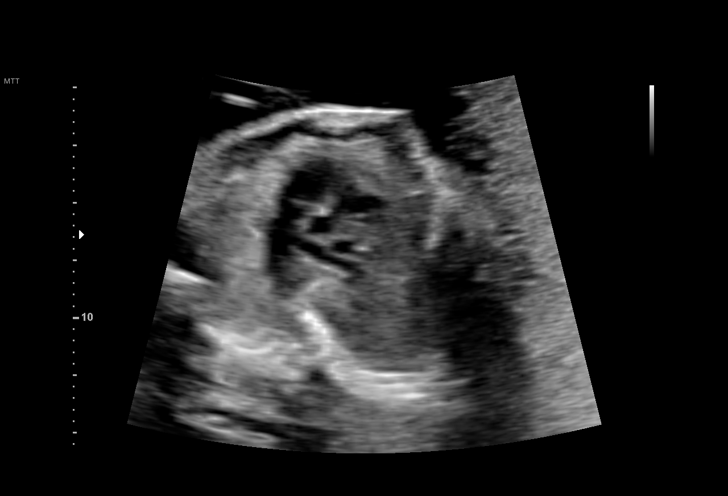
[im 50/84]
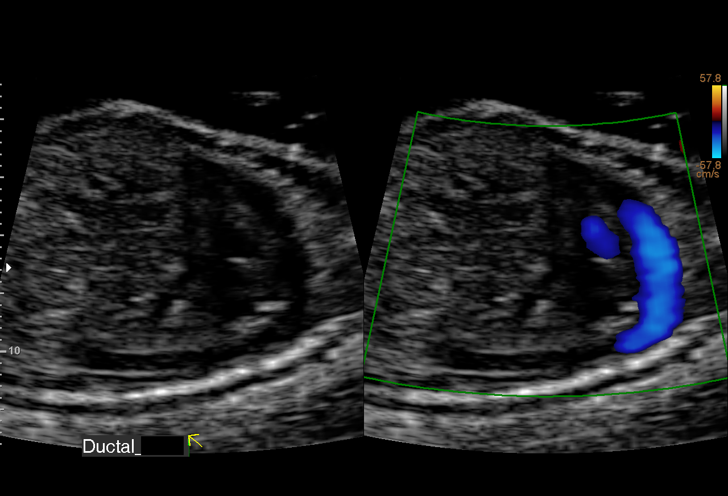
[im 56/84]
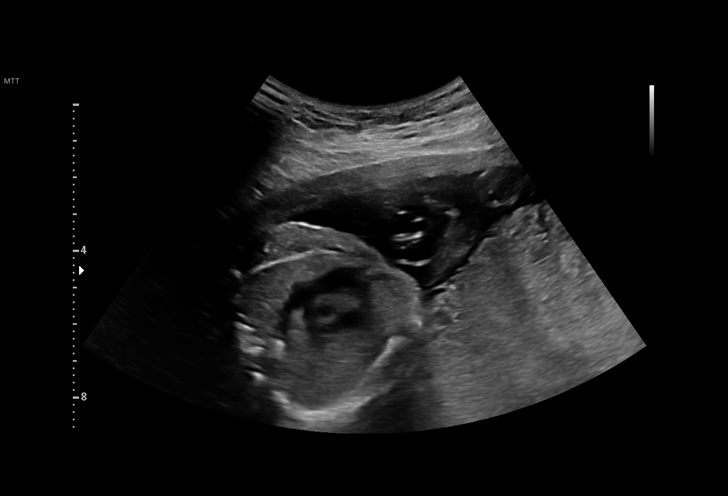
[im 62/84]
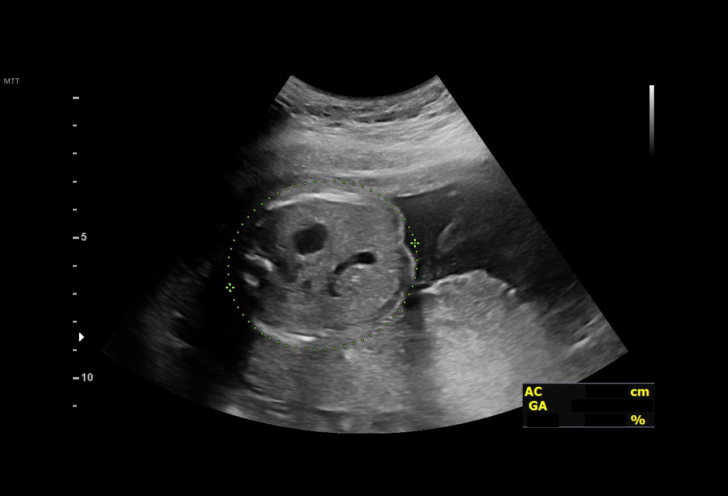
[im 68/84]
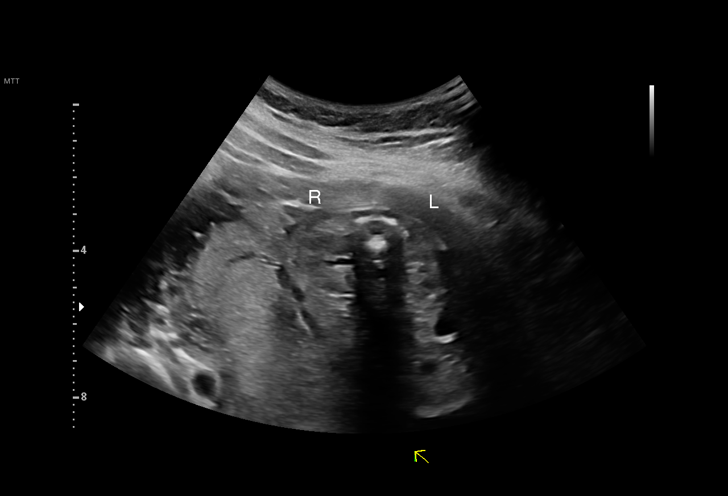
[im 74/84]
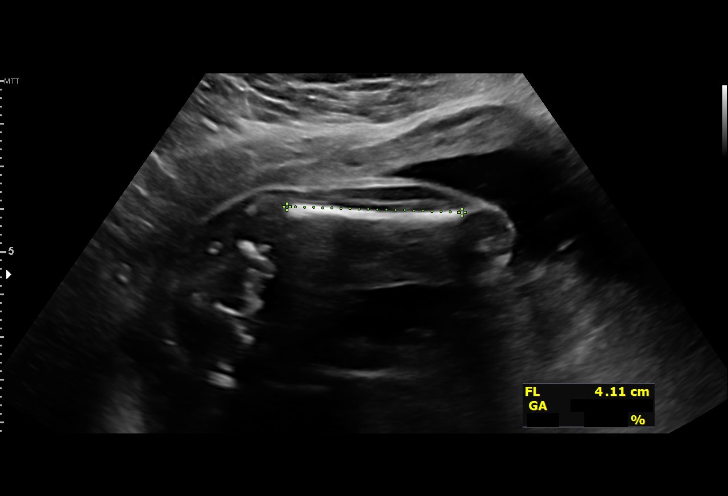
[im 80/84]
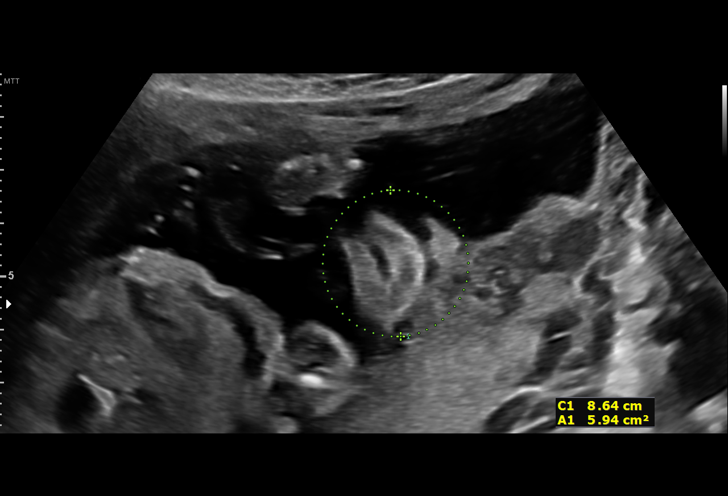

[13 of 28 positions shown; findings below may reference images not displayed]

Indications

 23 weeks gestation of pregnancy
 Negative AFP (Low Risk NIPS)
 Encounter for other antenatal screening
 follow-up
Fetal Evaluation

 Num Of Fetuses:         1
 Fetal Heart Rate(bpm):  130
 Cardiac Activity:       Observed
 Presentation:           Cephalic
 Placenta:               Posterior
 P. Cord Insertion:      Previously Visualized

 Amniotic Fluid
 AFI FV:      Within normal limits

                             Largest Pocket(cm)

Biometry

 BPD:      60.9  mm     G. Age:  24w 5d         95  %    CI:        76.74   %    70 - 86
                                                         FL/HC:      18.5   %    19.2 -
 HC:      220.2  mm     G. Age:  24w 0d         76  %    HC/AC:      1.12        1.05 -
 AC:      196.1  mm     G. Age:  24w 2d         81  %    FL/BPD:     67.0   %    71 - 87
 FL:       40.8  mm     G. Age:  23w 2d         46  %    FL/AC:      20.8   %    20 - 24
 CER:      27.6  mm     G. Age:  24w 4d     > 97.7  %
 LV:        4.6  mm
 CM:        6.3  mm

 Est. FW:     640  gm      1 lb 7 oz     84  %
OB History

 Gravidity:    2         Term:   0        Prem:   0        SAB:   1
 TOP:          0       Ectopic:  0        Living: 0
Gestational Age

 LMP:           23w 0d        Date:  02/08/20                 EDD:   11/14/20
 U/S Today:     24w 1d                                        EDD:   11/06/20
 Best:          23w 0d     Det. By:  LMP  (02/08/20)          EDD:   11/14/20
Anatomy

 Cranium:               Appears normal         Aortic Arch:            Appears normal
 Cavum:                 Appears normal         Ductal Arch:            Appears normal
 Ventricles:            Appears normal         Diaphragm:              Appears normal
 Choroid Plexus:        Previously seen        Stomach:                Appears normal, left
                                                                       sided
 Cerebellum:            Appears normal         Abdomen:                Previously seen
 Posterior Fossa:       Previously seen        Abdominal Wall:         Appears nml (cord
                                                                       insert, abd wall)
 Nuchal Fold:           Previously seen        Cord Vessels:           Previously seen
 Face:                  Appears normal         Kidneys:                Appear normal
                        (orbits and profile)
 Lips:                  Appears normal         Bladder:                Appears normal
 Thoracic:              Appears normal         Spine:                  Previously seen
 Heart:                 Appears normal         Upper Extremities:      Previously seen
                        (4CH, axis, and
                        situs)
 RVOT:                  Appears normal         Lower Extremities:      Previously seen
 LVOT:                  Appears normal

 Other:  Left Heel and 5th digit visualized previously. Right Hand not well
         visualized. Technically difficult due to fetal position.
Cervix Uterus Adnexa

 Cervix
 Length:           3.46  cm.
 Normal appearance by transabdominal scan.

 Uterus
 No abnormality visualized.

 Right Ovary
 Within normal limits. No adnexal mass visualized.

 Left Ovary
 Within normal limits. No adnexal mass visualized.

 Cul De Sac
 No free fluid seen.
 Adnexa
 No abnormality visualized.
Impression

 Patient returned for completion of fetal anatomy .Fetal
 biometry is consistent with her previously-established dates
 .Amniotic fluid is normal and good fetal activity is seen .Fetal
 anatomical survey was completed and appears normal.
Recommendations

 Follow-up scans as clinically indicated.
                 Pita, Ayyaanaa

## 2023-04-28 ENCOUNTER — Encounter (HOSPITAL_BASED_OUTPATIENT_CLINIC_OR_DEPARTMENT_OTHER): Payer: Self-pay | Admitting: Emergency Medicine

## 2023-04-28 ENCOUNTER — Emergency Department (HOSPITAL_BASED_OUTPATIENT_CLINIC_OR_DEPARTMENT_OTHER)
Admission: EM | Admit: 2023-04-28 | Discharge: 2023-04-28 | Disposition: A | Discharge status: Home / Self Care | Payer: Medicaid Other | Attending: Emergency Medicine | Admitting: Emergency Medicine

## 2023-04-28 ENCOUNTER — Other Ambulatory Visit: Payer: Self-pay

## 2023-04-28 ENCOUNTER — Other Ambulatory Visit (HOSPITAL_BASED_OUTPATIENT_CLINIC_OR_DEPARTMENT_OTHER): Payer: Self-pay

## 2023-04-28 DIAGNOSIS — R300 Dysuria: Secondary | ICD-10-CM | POA: Diagnosis present

## 2023-04-28 LAB — URINALYSIS, ROUTINE W REFLEX MICROSCOPIC
Bilirubin Urine: NEGATIVE
Glucose, UA: NEGATIVE mg/dL
Ketones, ur: NEGATIVE mg/dL
Nitrite: NEGATIVE
Protein, ur: 30 mg/dL — AB
Specific Gravity, Urine: 1.02 (ref 1.005–1.030)
pH: 7.5 (ref 5.0–8.0)

## 2023-04-28 LAB — URINALYSIS, MICROSCOPIC (REFLEX)

## 2023-04-28 LAB — PREGNANCY, URINE: Preg Test, Ur: NEGATIVE

## 2023-04-28 MED ORDER — NITROFURANTOIN MONOHYD MACRO 100 MG PO CAPS
100.0000 mg | ORAL_CAPSULE | Freq: Two times a day (BID) | ORAL | 0 refills | Status: DC
  Filled 2023-04-28: qty 10, 5d supply, fill #0

## 2023-04-28 MED ORDER — PHENAZOPYRIDINE HCL 95 MG PO TABS
95.0000 mg | ORAL_TABLET | Freq: Three times a day (TID) | ORAL | 0 refills | Status: DC | PRN
  Filled 2023-04-28: qty 30, 10d supply, fill #0

## 2023-04-28 NOTE — ED Notes (Signed)
ED Provider at bedside. 
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
## 2023-04-28 NOTE — Discharge Instructions (Signed)
Urinalysis was sent for culture.  I written for antibiotics, would recommend holding off on a few days to see if your symptoms worsen.  If they improved do not take antibiotics however symptoms worsen please start them  Follow-up outpatient Return for new or worsening symptoms.

## 2023-04-28 NOTE — ED Notes (Signed)
Lab called to add on urine culture.  ?

## 2023-04-28 NOTE — ED Triage Notes (Signed)
Pt endorses pain with urination. No pain at rest. Denies discharge.

## 2023-04-28 NOTE — ED Provider Notes (Signed)
[place] [facility] Provider Note   CSN: [id number] Arrival date & time: 04/28/23  1415    History  Chief Complaint  Patient presents with   Dysuria    Loretta Vazquez is a 22 y.o. female here for evaluation of dysuria, feels like at the end of her urination she has some burning.  No difficulty starting stream.  No fever, nausea, vomiting, abdominal pain, flank pain. Fully empties her bladder. Currently on menstrual cycle.  Denies chance of pregnancy.  She sexually active, she is lowered about STDs.  No discharge.Nexplanon of birth control.  Denies chance of pregnancy.  No history of kidney stones.   HPI     Home Medications Prior to Admission medications   Medication Sig Start Date End Date Taking? Authorizing Provider  nitrofurantoin, macrocrystal-monohydrate, (MACROBID) 100 MG capsule Take 1 capsule (100 mg total) by mouth 2 (two) times daily. 04/28/23  Yes Makinzie Considine A, PA-C  phenazopyridine (PYRIDIUM) 95 MG tablet Take 1 tablet (95 mg total) by mouth 3 (three) times daily as needed for pain. 04/28/23  Yes Anyiah Coverdale A, PA-C      Allergies    Patient has no known allergies.    Review of Systems   Review of Systems  Constitutional: Negative.   HENT: Negative.    Respiratory: Negative.    Cardiovascular: Negative.   Gastrointestinal: Negative.   Genitourinary:  Positive for dysuria. Negative for decreased urine volume, difficulty urinating, dyspareunia, enuresis, flank pain, frequency, genital sores, hematuria, menstrual problem, pelvic pain, urgency, vaginal bleeding, vaginal discharge and vaginal pain.  Musculoskeletal: Negative.   Skin: Negative.   Neurological: Negative.   All other systems reviewed and are negative.   Physical Exam Updated Vital Signs BP 125/84 (BP Location: Left Arm)   Pulse (!) 59   Temp 98.4 F (36.9 C) (Oral)   Resp 16   Ht 5\' 6"  (1.676 m)   Wt 81.6 kg   LMP 04/14/2023 (Approximate)    SpO2 100%   BMI 29.05 kg/m  Physical Exam Vitals and nursing note reviewed.  Constitutional:      General: She is not in acute distress.    Appearance: She is well-developed. She is not ill-appearing, toxic-appearing or diaphoretic.  HENT:     Head: Atraumatic.  Eyes:     Pupils: Pupils are equal, round, and reactive to light.  Cardiovascular:     Rate and Rhythm: Normal rate.  Pulmonary:     Effort: No respiratory distress.  Abdominal:     General: Bowel sounds are normal. There is no distension.     Palpations: Abdomen is soft.     Tenderness: There is no abdominal tenderness. There is no right CVA tenderness or left CVA tenderness.  Musculoskeletal:        General: Normal range of motion.     Cervical back: Normal range of motion.  Skin:    General: Skin is warm and dry.  Neurological:     General: No focal deficit present.     Mental Status: She is alert.  Psychiatric:        Mood and Affect: Mood normal.     ED Results / Procedures / Treatments   Labs (all labs ordered are listed, but only abnormal results are displayed) Labs Reviewed  URINE CULTURE - Abnormal; Notable for the following components:      Result Value   Culture >=100,000 COLONIES/mL STAPHYLOCOCCUS SAPROPHYTICUS (*)    Organism ID, Bacteria  STAPHYLOCOCCUS SAPROPHYTICUS (*)    All other components within normal limits  URINALYSIS, ROUTINE W REFLEX MICROSCOPIC - Abnormal; Notable for the following components:   Hgb urine dipstick MODERATE (*)    Protein, ur 30 (*)    Leukocytes,Ua SMALL (*)    All other components within normal limits  URINALYSIS, MICROSCOPIC (REFLEX) - Abnormal; Notable for the following components:   Bacteria, UA FEW (*)    All other components within normal limits  PREGNANCY, URINE    EKG None  Radiology No results found.  Procedures Procedures    Medications Ordered in ED Medications - No data to display  ED Course/ Medical Decision Making/ A&P   22 year old  here for evaluation of dysuria.  She is fully able to empty her bladder.  No flank pain.  No vaginal discharge or concern for any STD.  She is afebrile, nonseptic, not ill-appearing.  Labs personally viewed and interpreted:  Pregnancy test negative UA small leuks, few bacteria  Discussed results with patient.  Will send for culture given urine equivocal.  She was started on Macrobid, Pyridium.  Encouraged her to start taking while urine is pending culture.  Close follow-up outpatient, return for new or worsening symptoms.  The patient has been appropriately medically screened and/or stabilized in the ED. I have low suspicion for any other emergent medical condition which would require further screening, evaluation or treatment in the ED or require inpatient management.  Patient is hemodynamically stable and in no acute distress.  Patient able to ambulate in department prior to ED.  Evaluation does not show acute pathology that would require ongoing or additional emergent interventions while in the emergency department or further inpatient treatment.  I have discussed the diagnosis with the patient and answered all questions.  Pain is been managed while in the emergency department and patient has no further complaints prior to discharge.  Patient is comfortable with plan discussed in room and is stable for discharge at this time.  I have discussed strict return precautions for returning to the emergency department.  Patient was encouraged to follow-up with PCP/specialist refer to at discharge.                              Medical Decision Making Amount and/or Complexity of Data Reviewed External Data Reviewed: labs and notes. Labs: ordered. Decision-making details documented in ED Course.  Risk OTC drugs. Prescription drug management. Decision regarding hospitalization. Diagnosis or treatment significantly limited by social determinants of health.          Final Clinical  Impression(s) / ED Diagnoses Final diagnoses:  Dysuria    Rx / DC Orders ED Discharge Orders          Ordered    nitrofurantoin, macrocrystal-monohydrate, (MACROBID) 100 MG capsule  2 times daily        04/28/23 1609    phenazopyridine (PYRIDIUM) 95 MG tablet  3 times daily PRN        04/28/23 1609              Tiago Humphrey A, PA-C 05/01/23 2302    Terrilee Files, MD 05/02/23 [phone]

## 2023-04-29 LAB — URINE CULTURE: Culture: >=100000 — AB

## 2023-04-30 LAB — URINE CULTURE

## 2023-11-01 ENCOUNTER — Encounter (HOSPITAL_COMMUNITY): Payer: Self-pay | Admitting: Radiology

## 2023-11-01 ENCOUNTER — Other Ambulatory Visit: Payer: Self-pay

## 2023-11-01 ENCOUNTER — Emergency Department (HOSPITAL_COMMUNITY): Payer: Medicaid Other

## 2023-11-01 ENCOUNTER — Emergency Department (HOSPITAL_COMMUNITY)
Admission: EM | Admit: 2023-11-01 | Discharge: 2023-11-01 | Disposition: A | Discharge status: Home / Self Care | Payer: Medicaid Other | Attending: Emergency Medicine | Admitting: Emergency Medicine

## 2023-11-01 DIAGNOSIS — Z23 Encounter for immunization: Secondary | ICD-10-CM | POA: Diagnosis not present

## 2023-11-01 DIAGNOSIS — W3400XA Accidental discharge from unspecified firearms or gun, initial encounter: Secondary | ICD-10-CM | POA: Insufficient documentation

## 2023-11-01 DIAGNOSIS — S31132A Puncture wound of abdominal wall without foreign body, epigastric region without penetration into peritoneal cavity, initial encounter: Secondary | ICD-10-CM | POA: Diagnosis not present

## 2023-11-01 DIAGNOSIS — S52092B Other fracture of upper end of left ulna, initial encounter for open fracture type I or II: Secondary | ICD-10-CM

## 2023-11-01 DIAGNOSIS — S52002B Unspecified fracture of upper end of left ulna, initial encounter for open fracture type I or II: Secondary | ICD-10-CM | POA: Insufficient documentation

## 2023-11-01 DIAGNOSIS — S59902A Unspecified injury of left elbow, initial encounter: Secondary | ICD-10-CM | POA: Diagnosis present

## 2023-11-01 LAB — COMPREHENSIVE METABOLIC PANEL
ALT: 5 U/L (ref 0–44)
AST: 19 U/L (ref 15–41)
Albumin: 3.8 g/dL (ref 3.5–5.0)
Alkaline Phosphatase: 44 U/L (ref 38–126)
Anion gap: 13 (ref 5–15)
BUN: 10 mg/dL (ref 6–20)
CO2: 19 mmol/L — ABNORMAL LOW (ref 22–32)
Calcium: 8.2 mg/dL — ABNORMAL LOW (ref 8.9–10.3)
Chloride: 106 mmol/L (ref 98–111)
Creatinine, Ser: 0.77 mg/dL (ref 0.44–1.00)
GFR, Estimated: >60 mL/min (ref >60)
Glucose, Bld: 102 mg/dL — ABNORMAL HIGH (ref 70–99)
Potassium: 3.5 mmol/L (ref 3.5–5.1)
Sodium: 138 mmol/L (ref 135–145)
Total Bilirubin: 0.4 mg/dL (ref 0.0–1.2)
Total Protein: 6.2 g/dL — ABNORMAL LOW (ref 6.5–8.1)

## 2023-11-01 LAB — I-STAT CHEM 8, ED
BUN: 10 mg/dL (ref 6–20)
Calcium, Ion: 1 mmol/L — ABNORMAL LOW (ref 1.15–1.40)
Chloride: 106 mmol/L (ref 98–111)
Creatinine, Ser: 0.8 mg/dL (ref 0.44–1.00)
Glucose, Bld: 100 mg/dL — ABNORMAL HIGH (ref 70–99)
HCT: 35 % — ABNORMAL LOW (ref 36.0–46.0)
Hemoglobin: 11.9 g/dL — ABNORMAL LOW (ref 12.0–15.0)
Potassium: 3.4 mmol/L — ABNORMAL LOW (ref 3.5–5.1)
Sodium: 139 mmol/L (ref 135–145)
TCO2: 23 mmol/L (ref 22–32)

## 2023-11-01 LAB — PROTIME-INR
INR: 1.1 (ref 0.8–1.2)
Prothrombin Time: 14 s (ref 11.4–15.2)

## 2023-11-01 LAB — CBC
HCT: 35.5 % — ABNORMAL LOW (ref 36.0–46.0)
Hemoglobin: 12 g/dL (ref 12.0–15.0)
MCH: 33.4 pg (ref 26.0–34.0)
MCHC: 33.8 g/dL (ref 30.0–36.0)
MCV: 98.9 fL (ref 80.0–100.0)
Platelets: 175 10*3/uL (ref 150–400)
RBC: 3.59 MIL/uL — ABNORMAL LOW (ref 3.87–5.11)
RDW: 13 % (ref 11.5–15.5)
WBC: 9.2 10*3/uL (ref 4.0–10.5)
nRBC: 0 % (ref 0.0–0.2)

## 2023-11-01 LAB — I-STAT CG4 LACTIC ACID, ED: Lactic Acid, Venous: 2.5 mmol/L (ref 0.5–1.9)

## 2023-11-01 LAB — HCG, SERUM, QUALITATIVE: Preg, Serum: NEGATIVE

## 2023-11-01 MED ORDER — IOHEXOL 350 MG/ML SOLN
75.0000 mL | Freq: Once | INTRAVENOUS | Status: AC | PRN
  Administered 2023-11-01: 75 mL via INTRAVENOUS

## 2023-11-01 MED ORDER — CEFAZOLIN SODIUM-DEXTROSE 2-4 GM/100ML-% IV SOLN
2.0000 g | Freq: Once | INTRAVENOUS | Status: AC
  Administered 2023-11-01: 2 g via INTRAVENOUS

## 2023-11-01 MED ORDER — TETANUS-DIPHTH-ACELL PERTUSSIS 5-2.5-18.5 LF-MCG/0.5 IM SUSY
0.5000 mL | PREFILLED_SYRINGE | Freq: Once | INTRAMUSCULAR | Status: AC
  Administered 2023-11-01: 0.5 mL via INTRAMUSCULAR

## 2023-11-01 MED ORDER — HYDROMORPHONE HCL 1 MG/ML IJ SOLN
0.5000 mg | INTRAMUSCULAR | Status: DC | PRN
  Administered 2023-11-01: 0.5 mg via INTRAVENOUS
  Filled 2023-11-01: qty 1

## 2023-11-01 MED ORDER — OXYCODONE HCL 5 MG PO TABS: 2.5000 mg | ORAL_TABLET | Freq: Four times a day (QID) | ORAL | 0 refills | Status: AC | PRN

## 2023-11-01 MED ORDER — LIDOCAINE-EPINEPHRINE (PF) 2 %-1:200000 IJ SOLN
10.0000 mL | Freq: Once | INTRAMUSCULAR | Status: AC
  Administered 2023-11-01: 10 mL via INTRADERMAL
  Filled 2023-11-01: qty 20

## 2023-11-01 MED ORDER — METHOCARBAMOL 500 MG PO TABS: 500.0000 mg | ORAL_TABLET | Freq: Three times a day (TID) | ORAL | 0 refills | Status: DC | PRN

## 2023-11-01 MED ORDER — SODIUM CHLORIDE 0.9 % IV BOLUS
1000.0000 mL | Freq: Once | INTRAVENOUS | Status: AC
  Administered 2023-11-01: 1000 mL via INTRAVENOUS

## 2023-11-01 MED ORDER — CEPHALEXIN 500 MG PO CAPS: 500.0000 mg | ORAL_CAPSULE | Freq: Three times a day (TID) | ORAL | 0 refills | Status: AC
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
## 2023-11-01 NOTE — ED Notes (Signed)
Patient abdominal wound cleaned and irrigated.

## 2023-11-01 NOTE — ED Notes (Signed)
Patient and family educated on discharged instructions. Time was given for questions and questions were answered.
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
## 2023-11-01 NOTE — ED Notes (Signed)
Patient transported to CT
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
## 2023-11-01 NOTE — Progress Notes (Addendum)
Orthopedic Tech Progress Note Patient Details:  Loretta Vazquez [birth year] [id number]  Ortho Devices Type of Ortho Device: Post (long arm) splint, Arm sling Ortho Device/Splint Location: lue Ortho Device/Splint Interventions: Ordered, Application, Adjustment  The rn held the arm while I applied a xeroform dressing with an abd pad and wrapped it with a kerlix before applying the splint. Post Interventions Patient Tolerated: Well Instructions Provided: Care of device, Adjustment of device  Trinna Post 11/01/2023, 4:09 AM
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
## 2023-11-02 ENCOUNTER — Encounter (HOSPITAL_BASED_OUTPATIENT_CLINIC_OR_DEPARTMENT_OTHER): Payer: Self-pay | Admitting: Emergency Medicine

## 2023-11-04 ENCOUNTER — Ambulatory Visit
Admission: RE | Admit: 2023-11-04 | Discharge: 2023-11-04 | Disposition: A | Discharge status: Home / Self Care | Payer: Medicaid Other | Source: Ambulatory Visit | Attending: Family Medicine | Admitting: Family Medicine

## 2023-11-04 ENCOUNTER — Other Ambulatory Visit: Payer: Self-pay

## 2023-11-04 VITALS — BP 113/71 | HR 78 | Temp 98.2°F | Resp 16 | Ht 66.0 in | Wt 165.0 lb

## 2023-11-04 DIAGNOSIS — Z5189 Encounter for other specified aftercare: Secondary | ICD-10-CM

## 2023-11-04 DIAGNOSIS — S51032D Puncture wound without foreign body of left elbow, subsequent encounter: Secondary | ICD-10-CM | POA: Diagnosis not present
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
## 2023-11-04 NOTE — Discharge Instructions (Signed)
Follow all instructions provided to you the emergency department and by your surgeon.  The splint on until seen by the specialist.  Continue taking your antibiotics.

## 2024-04-25 ENCOUNTER — Other Ambulatory Visit: Payer: Self-pay

## 2024-04-25 ENCOUNTER — Ambulatory Visit (HOSPITAL_COMMUNITY)
Admission: EM | Admit: 2024-04-25 | Discharge: 2024-04-25 | Disposition: A | Discharge status: Home / Self Care | Attending: Family Medicine | Admitting: Family Medicine

## 2024-04-25 ENCOUNTER — Encounter (HOSPITAL_COMMUNITY): Payer: Self-pay | Admitting: *Deleted

## 2024-04-25 ENCOUNTER — Other Ambulatory Visit (HOSPITAL_COMMUNITY): Payer: Self-pay

## 2024-04-25 DIAGNOSIS — M542 Cervicalgia: Secondary | ICD-10-CM | POA: Diagnosis not present

## 2024-04-25 MED ORDER — KETOROLAC TROMETHAMINE 10 MG PO TABS
10.0000 mg | ORAL_TABLET | Freq: Four times a day (QID) | ORAL | 0 refills | Status: AC | PRN
  Filled 2024-04-25: qty 20, 5d supply, fill #0

## 2024-04-25 MED ORDER — TIZANIDINE HCL 4 MG PO TABS
2.0000 mg | ORAL_TABLET | Freq: Three times a day (TID) | ORAL | 0 refills | Status: AC | PRN
  Filled 2024-04-25: qty 15, 5d supply, fill #0

## 2024-04-25 MED ORDER — KETOROLAC TROMETHAMINE 30 MG/ML IJ SOLN
30.0000 mg | Freq: Once | INTRAMUSCULAR | Status: AC
  Administered 2024-04-25: 30 mg via INTRAMUSCULAR

## 2024-04-25 MED ORDER — KETOROLAC TROMETHAMINE 30 MG/ML IJ SOLN
INTRAMUSCULAR | Status: AC
  Filled 2024-04-25: qty 1

## 2024-04-25 NOTE — ED Triage Notes (Signed)
 PT reports Lt sided neck stiffness for one week. Pt tried ice and heat with out relief. Pt took a muscle relaxer also but not relief.

## 2024-04-25 NOTE — ED Provider Notes (Signed)
 [facility]    CSN: [id number] Arrival date & time: 04/25/24  1006      History   Chief Complaint Chief Complaint  Patient presents with   Neck Pain    HPI Loretta Vazquez is a 23 y.o. female.    Neck Pain  Here for pain in her left neck.  It has been bothering her for 5 days.  She has tried some methocarbamol  she had at home and it is not helping.  No fever or rash  NKDA  Last menstrual cycle began July 17. History reviewed. No pertinent past medical history.  Patient Active Problem List   Diagnosis Date Noted   Insertion of Nexplanon     Preterm premature rupture of membranes (PPROM) delivered, current hospitalization 04/05/2021   Iron  deficiency anemia secondary to inadequate dietary iron  intake 08/13/2020   Supervision of other normal pregnancy, antepartum 04/19/2020    Past Surgical History:  Procedure Laterality Date   DENTAL SURGERY      OB History     Gravida  3   Para  1   Term  0   Preterm  1   AB  1   Living         SAB  1   IAB  0   Ectopic  0   Multiple      Live Births               Home Medications    Prior to Admission medications   Medication Sig Start Date End Date Taking? Authorizing Provider  ketorolac  (TORADOL ) 10 MG tablet Take 1 tablet (10 mg total) by mouth every 6 (six) hours as needed (pain). 04/25/24  Yes Vonna Sharlet POUR, MD  tiZANidine  (ZANAFLEX ) 4 MG tablet Take 0.5-1 tablets (2-4 mg total) by mouth every 8 (eight) hours as needed for muscle spasms. 04/25/24  Yes Bryann Gentz K, MD  CRANBERRY PO Take 1 tablet by mouth daily.    [provider]  Cyanocobalamin 2500 MCG SUBL Place 1 tablet under the tongue daily.    [provider]  Multiple Vitamins-Minerals (MULTIVITAMIN WOMEN PO) Take 1 tablet by mouth daily.    [provider]    Family History History reviewed. No pertinent family history.  Social History Social History   Tobacco Use   Smoking  status: Former    Types: Cigars, Cigarettes   Smokeless tobacco: Never  Vaping Use   Vaping status: Never Used  Substance Use Topics   Alcohol use: No   Drug use: Yes    Types: Marijuana    Comment: Last smoked June 2021     Allergies   Shrimp (diagnostic) and Bee venom   Review of Systems Review of Systems  Musculoskeletal:  Positive for neck pain.     Physical Exam Triage Vital Signs ED Triage Vitals  Encounter Vitals Group     BP 04/25/24 1028 103/74     Girls Systolic BP Percentile --      Girls Diastolic BP Percentile --      Boys Systolic BP Percentile --      Boys Diastolic BP Percentile --      Pulse Rate 04/25/24 1028 62     Resp 04/25/24 1028 18     Temp 04/25/24 1028 98.8 F (37.1 C)     Temp src --      SpO2 04/25/24 1028 100 %     Weight --      Height --  Head Circumference --      Peak Flow --      Pain Score 04/25/24 1026 10     Pain Loc --      Pain Education --      Exclude from Growth Chart --    No data found.  Updated Vital Signs BP 103/74   Pulse 62   Temp 98.8 F (37.1 C)   Resp 18   LMP 04/25/2024   SpO2 100%   Visual Acuity Right Eye Distance:   Left Eye Distance:   Bilateral Distance:    Right Eye Near:   Left Eye Near:    Bilateral Near:     Physical Exam Vitals reviewed.  Constitutional:      General: She is not in acute distress.    Comments: In some obvious discomfort with an ice pack on her left neck and proximal shoulder.  Eyes:     Extraocular Movements: Extraocular movements intact.     Pupils: Pupils are equal, round, and reactive to light.  Cardiovascular:     Rate and Rhythm: Normal rate and regular rhythm.  Pulmonary:     Effort: Pulmonary effort is normal.     Breath sounds: Normal breath sounds.  Musculoskeletal:     Cervical back: Tenderness (over left neck/trapezius) present.  Skin:    Coloration: Skin is not jaundiced or pale.  Neurological:     General: No focal deficit present.      Mental Status: She is alert and oriented to person, place, and time.  Psychiatric:        Behavior: Behavior normal.      UC Treatments / Results  Labs (all labs ordered are listed, but only abnormal results are displayed) Labs Reviewed - No data to display  EKG   Radiology No results found.  Procedures Procedures (including critical care time)  Medications Ordered in UC Medications  ketorolac  (TORADOL ) 30 MG/ML injection 30 mg (30 mg Intramuscular Given 04/25/24 1118)    Initial Impression / Assessment and Plan / UC Course  I have reviewed the triage vital signs and the nursing notes.  Pertinent labs & imaging results that were available during my care of the patient were reviewed by me and considered in my medical decision making (see chart for details).    Toradol  injections given here and Toradol  tablets are sent to the pharmacy along with a different muscle relaxer, tizanidine .  She is given some range of motion exercises for her neck and I have asked her to follow-up with primary care about this issue   Final Clinical Impressions(s) / UC Diagnoses   Final diagnoses:  Neck pain     Discharge Instructions      You have been given a shot of Toradol  30 mg today.  Ketorolac  10 mg tablets--take 1 tablet every 6 hours as needed for pain.  This is the same medicine that is in the shot we just gave you   Take tizanidine  4 mg--1/2-1 tablet every 8 hours as needed for muscle spasms; this medication can cause dizziness and sleepiness  Ice can help the inflammation in the sore muscles, but heating pad or heat patch like Salonpas may also help relax the sore muscles.  Please follow-up with your primary care about this issue         ED Prescriptions     Medication Sig Dispense Auth. Provider   ketorolac  (TORADOL ) 10 MG tablet Take 1 tablet (10 mg total) by mouth every  6 (six) hours as needed (pain). 20 tablet Vonna Sharlet POUR, MD   tiZANidine  (ZANAFLEX ) 4  MG tablet Take 0.5-1 tablets (2-4 mg total) by mouth every 8 (eight) hours as needed for muscle spasms. 15 tablet Raul Winterhalter K, MD      PDMP not reviewed this encounter.   Vonna Sharlet POUR, MD 04/25/24 1120

## 2024-04-25 NOTE — Discharge Instructions (Signed)
 You have been given a shot of Toradol  30 mg today.  Ketorolac  10 mg tablets--take 1 tablet every 6 hours as needed for pain.  This is the same medicine that is in the shot we just gave you   Take tizanidine  4 mg--1/2-1 tablet every 8 hours as needed for muscle spasms; this medication can cause dizziness and sleepiness  Ice can help the inflammation in the sore muscles, but heating pad or heat patch like Salonpas may also help relax the sore muscles.  Please follow-up with your primary care about this issue

## 2024-05-04 ENCOUNTER — Other Ambulatory Visit (HOSPITAL_COMMUNITY): Payer: Self-pay

## 2024-06-07 ENCOUNTER — Other Ambulatory Visit: Payer: Self-pay
